# Patient Record
Sex: Male | Born: 1955 | Race: White | Hispanic: No | Marital: Married | Smoking: Former smoker
Health system: Southern US, Community
[De-identification: ages and names within clinical notes are randomized; demographics above are authoritative.]

## PROBLEM LIST (undated history)

## (undated) DIAGNOSIS — N179 Acute kidney failure, unspecified: Secondary | ICD-10-CM

## (undated) DIAGNOSIS — I2510 Atherosclerotic heart disease of native coronary artery without angina pectoris: Secondary | ICD-10-CM

## (undated) DIAGNOSIS — E1121 Type 2 diabetes mellitus with diabetic nephropathy: Secondary | ICD-10-CM

## (undated) DIAGNOSIS — I10 Essential (primary) hypertension: Secondary | ICD-10-CM

## (undated) DIAGNOSIS — I639 Cerebral infarction, unspecified: Secondary | ICD-10-CM

## (undated) DIAGNOSIS — Z7902 Long term (current) use of antithrombotics/antiplatelets: Secondary | ICD-10-CM

## (undated) DIAGNOSIS — R809 Proteinuria, unspecified: Secondary | ICD-10-CM

## (undated) DIAGNOSIS — E782 Mixed hyperlipidemia: Secondary | ICD-10-CM

## (undated) DIAGNOSIS — K5010 Crohn's disease of large intestine without complications: Secondary | ICD-10-CM

## (undated) DIAGNOSIS — Z79899 Other long term (current) drug therapy: Secondary | ICD-10-CM

## (undated) DIAGNOSIS — K219 Gastro-esophageal reflux disease without esophagitis: Secondary | ICD-10-CM

## (undated) DIAGNOSIS — E538 Deficiency of other specified B group vitamins: Secondary | ICD-10-CM

## (undated) DIAGNOSIS — Z794 Long term (current) use of insulin: Secondary | ICD-10-CM

## (undated) DIAGNOSIS — I2584 Coronary atherosclerosis due to calcified coronary lesion: Secondary | ICD-10-CM

## (undated) HISTORY — DX: Atherosclerotic heart disease of native coronary artery without angina pectoris: I25.10

## (undated) HISTORY — DX: Long term (current) use of antithrombotics/antiplatelets: Z79.02

## (undated) HISTORY — DX: Essential (primary) hypertension: I10

## (undated) HISTORY — DX: Long term (current) use of insulin: E11.21

## (undated) HISTORY — DX: Cerebral infarction, unspecified: I63.9

## (undated) HISTORY — DX: Hypomagnesemia: E83.42

## (undated) HISTORY — DX: Gastro-esophageal reflux disease without esophagitis: K21.9

## (undated) HISTORY — PX: COLONOSCOPY: SHX174

## (undated) HISTORY — DX: Coronary atherosclerosis due to calcified coronary lesion: I25.84

## (undated) HISTORY — DX: Other long term (current) drug therapy: Z79.899

## (undated) HISTORY — DX: Proteinuria, unspecified: R80.9

## (undated) HISTORY — PX: NECK MASS EXCISION: SHX2079

## (undated) HISTORY — DX: Deficiency of other specified B group vitamins: E53.8

## (undated) HISTORY — PX: DG GREAT TOE LEFT FOOT: HXRAD1656

## (undated) HISTORY — PX: CHOLECYSTECTOMY: SHX55

## (undated) HISTORY — DX: Long term (current) use of insulin: Z79.4

## (undated) HISTORY — DX: Mixed hyperlipidemia: E78.2

---

## 2010-11-30 DEATH — deceased

## 2015-04-02 DIAGNOSIS — N179 Acute kidney failure, unspecified: Secondary | ICD-10-CM

## 2015-04-02 HISTORY — DX: Acute kidney failure, unspecified: N17.9

## 2015-08-14 ENCOUNTER — Encounter (INDEPENDENT_AMBULATORY_CARE_PROVIDER_SITE_OTHER): Payer: Self-pay | Admitting: Internal Medicine

## 2015-08-14 ENCOUNTER — Encounter (HOSPITAL_COMMUNITY): Payer: Self-pay

## 2015-08-14 ENCOUNTER — Emergency Department (HOSPITAL_COMMUNITY): Payer: Medicare Other

## 2015-08-14 ENCOUNTER — Ambulatory Visit (INDEPENDENT_AMBULATORY_CARE_PROVIDER_SITE_OTHER): Payer: BLUE CROSS/BLUE SHIELD | Admitting: Internal Medicine

## 2015-08-14 ENCOUNTER — Inpatient Hospital Stay (HOSPITAL_COMMUNITY)
Admission: EM | Admit: 2015-08-14 | Discharge: 2015-08-16 | DRG: 315 | Disposition: A | Discharge status: Home / Self Care | Payer: Medicare Other | Attending: Internal Medicine | Admitting: Internal Medicine

## 2015-08-14 ENCOUNTER — Other Ambulatory Visit (INDEPENDENT_AMBULATORY_CARE_PROVIDER_SITE_OTHER): Payer: Self-pay | Admitting: *Deleted

## 2015-08-14 VITALS — BP 82/58 | HR 64 | Temp 96.9°F | Resp 18 | Ht 72.0 in | Wt 249.9 lb

## 2015-08-14 DIAGNOSIS — N183 Chronic kidney disease, stage 3 unspecified: Secondary | ICD-10-CM | POA: Diagnosis present

## 2015-08-14 DIAGNOSIS — R630 Anorexia: Secondary | ICD-10-CM | POA: Diagnosis present

## 2015-08-14 DIAGNOSIS — E782 Mixed hyperlipidemia: Secondary | ICD-10-CM | POA: Diagnosis present

## 2015-08-14 DIAGNOSIS — R1319 Other dysphagia: Secondary | ICD-10-CM

## 2015-08-14 DIAGNOSIS — K219 Gastro-esophageal reflux disease without esophagitis: Secondary | ICD-10-CM | POA: Diagnosis not present

## 2015-08-14 DIAGNOSIS — Z87891 Personal history of nicotine dependence: Secondary | ICD-10-CM

## 2015-08-14 DIAGNOSIS — I129 Hypertensive chronic kidney disease with stage 1 through stage 4 chronic kidney disease, or unspecified chronic kidney disease: Secondary | ICD-10-CM | POA: Diagnosis present

## 2015-08-14 DIAGNOSIS — I10 Essential (primary) hypertension: Secondary | ICD-10-CM | POA: Diagnosis present

## 2015-08-14 DIAGNOSIS — I959 Hypotension, unspecified: Principal | ICD-10-CM | POA: Diagnosis present

## 2015-08-14 DIAGNOSIS — R1310 Dysphagia, unspecified: Secondary | ICD-10-CM

## 2015-08-14 DIAGNOSIS — Z8673 Personal history of transient ischemic attack (TIA), and cerebral infarction without residual deficits: Secondary | ICD-10-CM | POA: Diagnosis not present

## 2015-08-14 DIAGNOSIS — E119 Type 2 diabetes mellitus without complications: Secondary | ICD-10-CM

## 2015-08-14 DIAGNOSIS — D72829 Elevated white blood cell count, unspecified: Secondary | ICD-10-CM | POA: Diagnosis present

## 2015-08-14 DIAGNOSIS — E871 Hypo-osmolality and hyponatremia: Secondary | ICD-10-CM | POA: Diagnosis present

## 2015-08-14 DIAGNOSIS — Z833 Family history of diabetes mellitus: Secondary | ICD-10-CM | POA: Diagnosis not present

## 2015-08-14 DIAGNOSIS — E1122 Type 2 diabetes mellitus with diabetic chronic kidney disease: Secondary | ICD-10-CM | POA: Diagnosis present

## 2015-08-14 DIAGNOSIS — R197 Diarrhea, unspecified: Secondary | ICD-10-CM | POA: Diagnosis not present

## 2015-08-14 DIAGNOSIS — E1121 Type 2 diabetes mellitus with diabetic nephropathy: Secondary | ICD-10-CM | POA: Diagnosis present

## 2015-08-14 DIAGNOSIS — K5010 Crohn's disease of large intestine without complications: Secondary | ICD-10-CM | POA: Diagnosis present

## 2015-08-14 DIAGNOSIS — Z823 Family history of stroke: Secondary | ICD-10-CM | POA: Diagnosis not present

## 2015-08-14 DIAGNOSIS — Z8261 Family history of arthritis: Secondary | ICD-10-CM | POA: Diagnosis not present

## 2015-08-14 DIAGNOSIS — E860 Dehydration: Secondary | ICD-10-CM | POA: Diagnosis present

## 2015-08-14 DIAGNOSIS — Z8249 Family history of ischemic heart disease and other diseases of the circulatory system: Secondary | ICD-10-CM | POA: Diagnosis not present

## 2015-08-14 DIAGNOSIS — R1314 Dysphagia, pharyngoesophageal phase: Secondary | ICD-10-CM

## 2015-08-14 DIAGNOSIS — Z794 Long term (current) use of insulin: Secondary | ICD-10-CM

## 2015-08-14 DIAGNOSIS — K210 Gastro-esophageal reflux disease with esophagitis: Secondary | ICD-10-CM | POA: Diagnosis present

## 2015-08-14 DIAGNOSIS — I9589 Other hypotension: Secondary | ICD-10-CM | POA: Diagnosis not present

## 2015-08-14 DIAGNOSIS — IMO0001 Reserved for inherently not codable concepts without codable children: Secondary | ICD-10-CM

## 2015-08-14 DIAGNOSIS — N179 Acute kidney failure, unspecified: Secondary | ICD-10-CM | POA: Diagnosis present

## 2015-08-14 DIAGNOSIS — E872 Acidosis: Secondary | ICD-10-CM | POA: Diagnosis present

## 2015-08-14 DIAGNOSIS — Z8719 Personal history of other diseases of the digestive system: Secondary | ICD-10-CM

## 2015-08-14 HISTORY — DX: Crohn's disease of large intestine without complications: K50.10

## 2015-08-14 HISTORY — DX: Acute kidney failure, unspecified: N17.9

## 2015-08-14 LAB — CBC WITH DIFFERENTIAL/PLATELET
BASOS ABS: 0 10*3/uL (ref 0.0–0.1)
BASOS PCT: 0 %
Eosinophils Absolute: 0.1 10*3/uL (ref 0.0–0.7)
Eosinophils Relative: 0 %
HEMATOCRIT: 46.1 % (ref 39.0–52.0)
HEMOGLOBIN: 15.3 g/dL (ref 13.0–17.0)
LYMPHS PCT: 25 %
Lymphs Abs: 3.6 10*3/uL (ref 0.7–4.0)
MCH: 28.9 pg (ref 26.0–34.0)
MCHC: 33.2 g/dL (ref 30.0–36.0)
MCV: 87.1 fL (ref 78.0–100.0)
Monocytes Absolute: 1.1 10*3/uL — ABNORMAL HIGH (ref 0.1–1.0)
Monocytes Relative: 8 %
NEUTROS ABS: 9.7 10*3/uL — AB (ref 1.7–7.7)
NEUTROS PCT: 67 %
Platelets: 376 10*3/uL (ref 150–400)
RBC: 5.29 MIL/uL (ref 4.22–5.81)
RDW: 14.9 % (ref 11.5–15.5)
WBC: 14.4 10*3/uL — ABNORMAL HIGH (ref 4.0–10.5)

## 2015-08-14 LAB — COMPREHENSIVE METABOLIC PANEL
ALBUMIN: 4.4 g/dL (ref 3.5–5.0)
ALT: 20 U/L (ref 17–63)
ANION GAP: 9 (ref 5–15)
AST: 17 U/L (ref 15–41)
Alkaline Phosphatase: 89 U/L (ref 38–126)
BUN: 33 mg/dL — ABNORMAL HIGH (ref 6–20)
CHLORIDE: 99 mmol/L — AB (ref 101–111)
CO2: 21 mmol/L — AB (ref 22–32)
CREATININE: 3.15 mg/dL — AB (ref 0.61–1.24)
Calcium: 8.9 mg/dL (ref 8.9–10.3)
GFR calc non Af Amer: 20 mL/min — ABNORMAL LOW (ref >60)
GFR, EST AFRICAN AMERICAN: 23 mL/min — AB (ref >60)
GLUCOSE: 291 mg/dL — AB (ref 65–99)
Potassium: 4.7 mmol/L (ref 3.5–5.1)
SODIUM: 129 mmol/L — AB (ref 135–145)
Total Bilirubin: 0.4 mg/dL (ref 0.3–1.2)
Total Protein: 8.1 g/dL (ref 6.5–8.1)

## 2015-08-14 LAB — LACTIC ACID, PLASMA
LACTIC ACID, VENOUS: 4.4 mmol/L — AB (ref 0.5–2.0)
LACTIC ACID, VENOUS: 2.9 mmol/L — AB (ref 0.5–2.0)

## 2015-08-14 LAB — PROCALCITONIN

## 2015-08-14 LAB — LIPASE, BLOOD: Lipase: 78 U/L — ABNORMAL HIGH (ref 11–51)

## 2015-08-14 LAB — TROPONIN I: Troponin I: <0.03 ng/mL (ref <0.031)

## 2015-08-14 MED ORDER — HEPARIN SODIUM (PORCINE) 5000 UNIT/ML IJ SOLN
5000.0000 [IU] | Freq: Three times a day (TID) | INTRAMUSCULAR | Status: DC
  Administered 2015-08-15 (×4): 5000 [IU] via SUBCUTANEOUS
  Filled 2015-08-14 (×4): qty 1

## 2015-08-14 MED ORDER — PRAVASTATIN SODIUM 40 MG PO TABS
40.0000 mg | ORAL_TABLET | Freq: Every day | ORAL | Status: DC
  Administered 2015-08-15 – 2015-08-16 (×2): 40 mg via ORAL
  Filled 2015-08-14 (×2): qty 1

## 2015-08-14 MED ORDER — INSULIN ASPART 100 UNIT/ML ~~LOC~~ SOLN
6.0000 [IU] | Freq: Three times a day (TID) | SUBCUTANEOUS | Status: DC
  Administered 2015-08-15 – 2015-08-16 (×4): 6 [IU] via SUBCUTANEOUS

## 2015-08-14 MED ORDER — TAMSULOSIN HCL 0.4 MG PO CAPS
0.4000 mg | ORAL_CAPSULE | Freq: Every day | ORAL | Status: DC
  Administered 2015-08-15 – 2015-08-16 (×2): 0.4 mg via ORAL
  Filled 2015-08-14 (×2): qty 1

## 2015-08-14 MED ORDER — SODIUM CHLORIDE 0.9 % IV BOLUS (SEPSIS)
500.0000 mL | Freq: Once | INTRAVENOUS | Status: AC
  Administered 2015-08-14: 500 mL via INTRAVENOUS

## 2015-08-14 MED ORDER — SODIUM CHLORIDE 0.9 % IV BOLUS (SEPSIS)
1000.0000 mL | Freq: Once | INTRAVENOUS | Status: AC
  Administered 2015-08-14: 1000 mL via INTRAVENOUS

## 2015-08-14 MED ORDER — PENTOXIFYLLINE ER 400 MG PO TBCR
400.0000 mg | EXTENDED_RELEASE_TABLET | Freq: Three times a day (TID) | ORAL | Status: DC
  Administered 2015-08-15 – 2015-08-16 (×5): 400 mg via ORAL
  Filled 2015-08-14 (×5): qty 1

## 2015-08-14 MED ORDER — ONDANSETRON HCL 4 MG/2ML IJ SOLN
4.0000 mg | INTRAMUSCULAR | Status: AC | PRN
  Administered 2015-08-14 (×2): 4 mg via INTRAVENOUS
  Filled 2015-08-14 (×2): qty 2

## 2015-08-14 MED ORDER — ACETAMINOPHEN 650 MG RE SUPP: 650.0000 mg | Freq: Four times a day (QID) | RECTAL | Status: DC | PRN

## 2015-08-14 MED ORDER — TRAMADOL HCL 50 MG PO TABS: 50.0000 mg | ORAL_TABLET | Freq: Four times a day (QID) | ORAL | Status: DC | PRN

## 2015-08-14 MED ORDER — ACETAMINOPHEN 325 MG PO TABS
650.0000 mg | ORAL_TABLET | Freq: Four times a day (QID) | ORAL | Status: DC | PRN
  Administered 2015-08-15: 650 mg via ORAL
  Filled 2015-08-14: qty 2

## 2015-08-14 MED ORDER — INSULIN GLARGINE 100 UNIT/ML ~~LOC~~ SOLN
50.0000 [IU] | Freq: Two times a day (BID) | SUBCUTANEOUS | Status: DC
  Administered 2015-08-15 – 2015-08-16 (×4): 50 [IU] via SUBCUTANEOUS
  Filled 2015-08-14 (×8): qty 0.5

## 2015-08-14 MED ORDER — DEXLANSOPRAZOLE 60 MG PO CPDR: 60.0000 mg | DELAYED_RELEASE_CAPSULE | Freq: Every day | ORAL | Status: AC

## 2015-08-14 MED ORDER — INSULIN ASPART 100 UNIT/ML ~~LOC~~ SOLN
0.0000 [IU] | Freq: Three times a day (TID) | SUBCUTANEOUS | Status: DC
  Administered 2015-08-15 (×2): 3 [IU] via SUBCUTANEOUS
  Administered 2015-08-15 – 2015-08-16 (×2): 2 [IU] via SUBCUTANEOUS

## 2015-08-14 MED ORDER — LOPERAMIDE HCL 2 MG PO CAPS: 2.0000 mg | ORAL_CAPSULE | Freq: Four times a day (QID) | ORAL | Status: AC | PRN

## 2015-08-14 MED ORDER — SODIUM CHLORIDE 0.9 % IV SOLN
INTRAVENOUS | Status: DC
  Administered 2015-08-14: 100 mL/h via INTRAVENOUS

## 2015-08-14 MED ORDER — ONDANSETRON HCL 4 MG PO TABS: 4.0000 mg | ORAL_TABLET | Freq: Four times a day (QID) | ORAL | Status: DC | PRN

## 2015-08-14 MED ORDER — OXCARBAZEPINE 300 MG PO TABS
300.0000 mg | ORAL_TABLET | Freq: Three times a day (TID) | ORAL | Status: DC
  Administered 2015-08-15 – 2015-08-16 (×5): 300 mg via ORAL
  Filled 2015-08-14 (×11): qty 1

## 2015-08-14 MED ORDER — SODIUM CHLORIDE 0.9 % IV SOLN
INTRAVENOUS | Status: AC
  Administered 2015-08-15 (×2): via INTRAVENOUS

## 2015-08-14 MED ORDER — SODIUM CHLORIDE 0.9% FLUSH
3.0000 mL | Freq: Two times a day (BID) | INTRAVENOUS | Status: DC
  Administered 2015-08-15 – 2015-08-16 (×3): 3 mL via INTRAVENOUS

## 2015-08-14 MED ORDER — HYDROMORPHONE HCL 1 MG/ML IJ SOLN: 1.0000 mg | INTRAMUSCULAR | Status: DC | PRN

## 2015-08-14 MED ORDER — FAMOTIDINE IN NACL 20-0.9 MG/50ML-% IV SOLN
20.0000 mg | Freq: Once | INTRAVENOUS | Status: AC
  Administered 2015-08-14: 20 mg via INTRAVENOUS
  Filled 2015-08-14: qty 50

## 2015-08-14 MED ORDER — METOCLOPRAMIDE HCL 10 MG PO TABS
10.0000 mg | ORAL_TABLET | Freq: Two times a day (BID) | ORAL | Status: DC
  Administered 2015-08-15 – 2015-08-16 (×4): 10 mg via ORAL
  Filled 2015-08-14 (×4): qty 1

## 2015-08-14 MED ORDER — SODIUM CHLORIDE 0.9 % IV BOLUS (SEPSIS): 500.0000 mL | Freq: Once | INTRAVENOUS | Status: DC

## 2015-08-14 MED ORDER — PANTOPRAZOLE SODIUM 40 MG PO TBEC
40.0000 mg | DELAYED_RELEASE_TABLET | Freq: Every day | ORAL | Status: DC
  Administered 2015-08-15 (×2): 40 mg via ORAL
  Filled 2015-08-14 (×2): qty 1

## 2015-08-14 MED ORDER — SULFASALAZINE 500 MG PO TABS
500.0000 mg | ORAL_TABLET | Freq: Two times a day (BID) | ORAL | Status: DC
  Administered 2015-08-15 – 2015-08-16 (×4): 500 mg via ORAL
  Filled 2015-08-14 (×8): qty 1

## 2015-08-14 MED ORDER — DIATRIZOATE MEGLUMINE & SODIUM 66-10 % PO SOLN
ORAL | Status: AC
  Filled 2015-08-14: qty 30

## 2015-08-14 MED ORDER — PREGABALIN 75 MG PO CAPS
300.0000 mg | ORAL_CAPSULE | Freq: Two times a day (BID) | ORAL | Status: DC
  Administered 2015-08-15 – 2015-08-16 (×4): 300 mg via ORAL
  Filled 2015-08-14 (×4): qty 4

## 2015-08-14 MED ORDER — ONDANSETRON HCL 4 MG/2ML IJ SOLN: 4.0000 mg | Freq: Four times a day (QID) | INTRAMUSCULAR | Status: DC | PRN

## 2015-08-14 NOTE — ED Notes (Signed)
Lab at bedside

## 2015-08-14 NOTE — ED Notes (Signed)
Pt sent by Dr. Karilyn Cotaehman.  Sister says pt was sent here with orders for IVF and stool sample.  Pt has had diarrhea for 2 weeks, dizziness, nausea,  and decreased appetite.

## 2015-08-14 NOTE — ED Provider Notes (Signed)
CSN: [id number]     Arrival date & time 08/14/15  1637 History   First MD Initiated Contact with Patient 08/14/15 1643     Chief Complaint  Patient presents with  . Diarrhea      HPI Pt was seen at 1655.  Per pt, c/o gradual onset and persistence of multiple intermittent episodes of diarrhea for the past 2 weeks. Describes the stools as "yellow" and "watery." Has been associated with nausea and poor PO intake. Pt states he has "passed out a bunch of times," last time this morning, due to lightheadedness and generalized weakness. Endorses hx of same symptoms several months ago, stating he "had to get admitted for it because I was dehydrated." Pt was evaluated by his GI Dr. Karilyn Cota PTA, noted to be hypotensive, and then was sent to the ED for further evaluation. Denies abd pain, no CP/SOB, no back pain, no fevers, no black or blood in stools.     Past Medical History  Diagnosis Date  . Mixed hyperlipidemia   . Hypertension     Beign essential  . Microalbuminuria   . Ischemic stroke (HCC)   . Coronary artery disease due to calcified coronary lesion   . B12 deficiency   . GERD (gastroesophageal reflux disease)   . Hypomagnesemia   . Long term current use of antithrombotics/antiplatelets   . Hypomagnesemia   . Encounter for long-term current use of medication   . Crohn's colitis (HCC)   . Type 2 diabetes mellitus with diabetic nephropathy, with long-term current use of insulin (HCC)   . ARF (acute renal failure) (HCC) 04/2015    [facility]) due to dehydration from diarrhea   Past Surgical History  Procedure Laterality Date  . Dg great toe left foot      Removal  . Cholecystectomy    . Neck mass excision      Tumor - non- Malignant  . Colonoscopy      [facility]. Three years ago   Family History  Problem Relation Age of Onset  . Diabetes Sister   . Bipolar disorder Sister   . Hypertension Sister   . Stroke Sister   . Arthritis Brother   . Stroke Sister   . Healthy  Daughter    Social History  Substance Use Topics  . Smoking status: Former Smoker    Start date: 08/14/2010  . Smokeless tobacco: Current User    Types: Snuff  . Alcohol Use: No    Review of Systems ROS: Statement: All systems negative except as marked or noted in the HPI; Constitutional: Negative for fever and chills. ; ; Eyes: Negative for eye pain, redness and discharge. ; ; ENMT: Negative for ear pain, hoarseness, nasal congestion, sinus pressure and sore throat. ; ; Cardiovascular: Negative for chest pain, palpitations, diaphoresis, dyspnea and peripheral edema. ; ; Respiratory: Negative for cough, wheezing and stridor. ; ; Gastrointestinal: +decreased PO intake, nausea, diarrhea. Negative for vomiting, abdominal pain, blood in stool, hematemesis, jaundice and rectal bleeding. . ; ; Genitourinary: Negative for dysuria, flank pain and hematuria. ; ; Musculoskeletal: Negative for back pain and neck pain. Negative for swelling and trauma.; ; Skin: Negative for pruritus, rash, abrasions, blisters, bruising and skin lesion.; ; Neuro: +lightheadedness, syncope, generalized weakness. Negative for headache and neck stiffness. Negative for extremity weakness, paresthesias, involuntary movement, seizure.      Allergies  Review of patient's allergies indicates no known allergies.  Home Medications   Prior to Admission medications  Medication Sig Start Date End Date Taking? Authorizing Provider  Ca Carbonate-Mag Hydroxide (ROLAIDS PO) Take by mouth as needed.    Historical Provider, MD  clopidogrel (PLAVIX) 75 MG tablet Take 75 mg by mouth daily.    Historical Provider, MD  cyanocobalamin (,VITAMIN B-12,) 1000 MCG/ML injection Inject 1,000 mcg into the muscle every 30 (thirty) days.    Historical Provider, MD  dexlansoprazole (DEXILANT) 60 MG capsule Take 1 capsule (60 mg total) by mouth daily before breakfast. 08/14/15   Malissa HippoNajeeb U Rehman, MD  Insulin Glulisine (APIDRA SOLOSTAR) 100 UNIT/ML  Solostar Pen Inject 18 Units into the skin 3 (three) times daily.    Historical Provider, MD  Liraglutide (VICTOZA) 18 MG/3ML SOPN Inject 0.6 mg into the skin. 1.2 mg into skin daily.    Historical Provider, MD  loperamide (IMODIUM) 2 MG capsule Take 1 capsule (2 mg total) by mouth 4 (four) times daily as needed for diarrhea or loose stools. 08/14/15   Malissa HippoNajeeb U Rehman, MD  Magnesium 250 MG TABS Take 250 mg by mouth 2 (two) times daily.    Historical Provider, MD  metoCLOPramide (REGLAN) 10 MG tablet Take 10 mg by mouth 2 (two) times daily.    Historical Provider, MD  metoprolol tartrate (LOPRESSOR) 25 MG tablet Take 25 mg by mouth 2 (two) times daily.    Historical Provider, MD  Oxcarbazepine (TRILEPTAL) 300 MG tablet Take 300 mg by mouth 3 (three) times daily.    Historical Provider, MD  pentoxifylline (TRENTAL) 400 MG CR tablet Take 400 mg by mouth 3 (three) times daily with meals.    Historical Provider, MD  pravastatin (PRAVACHOL) 40 MG tablet Take 40 mg by mouth daily.    Historical Provider, MD  pregabalin (LYRICA) 300 MG capsule Take 300 mg by mouth 2 (two) times daily.    Historical Provider, MD  sulfaSALAzine (AZULFIDINE) 500 MG tablet Take 500 mg by mouth 2 (two) times daily.    Historical Provider, MD  tamsulosin (FLOMAX) 0.4 MG CAPS capsule Take 0.4 mg by mouth daily.    Historical Provider, MD  traMADol (ULTRAM) 50 MG tablet Take by mouth every 6 (six) hours as needed.    Historical Provider, MD   BP 90/58 mmHg  Pulse 86  Temp(Src) 98 F (36.7 C) (Oral)  Resp 16  Ht 6' (1.829 m)  Wt 247 lb (112.038 kg)  BMI 33.49 kg/m2  SpO2 99%   17:18:20 Orthostatic Vital Signs LR  Orthostatic Lying  - BP- Lying: 70/56 mmHg (Pt felt dizzy and was unable to take Bp sitting and standing up. ) ; Pulse- Lying: 81     Patient Vitals for the past 24 hrs:  BP Temp Temp src Pulse Resp SpO2 Height Weight  08/14/15 1730 (!) 88/62 mmHg - - 80 16 97 % - -  08/14/15 1641 90/58 mmHg 98 F (36.7 C)  Oral 86 16 99 % 6' (1.829 m) 247 lb (112.038 kg)     Physical Exam  1700: Physical examination:  Nursing notes reviewed; Vital signs and O2 SAT reviewed;  Constitutional: Well developed, Well nourished, In no acute distress; Head:  Normocephalic, atraumatic; Eyes: EOMI, PERRL, No scleral icterus; ENMT: Mouth and pharynx normal, Mucous membranes dry; Neck: Supple, Full range of motion, No lymphadenopathy; Cardiovascular: Regular rate and rhythm, No gallop; Respiratory: Breath sounds clear & equal bilaterally, No wheezes.  Speaking full sentences with ease, Normal respiratory effort/excursion; Chest: Nontender, Movement normal; Abdomen: Soft, Nontender, Nondistended, Normal bowel sounds; Genitourinary:  No CVA tenderness; Extremities: Pulses normal, No tenderness, No edema, No calf edema or asymmetry.; Neuro: AA&Ox3, Major CN grossly intact.  Speech clear. No gross focal motor or sensory deficits in extremities.; Skin: Color normal, Warm, Dry.   ED Course  Procedures (including critical care time) Labs Review  Imaging Review  I have personally reviewed and evaluated these images and lab results as part of my medical decision-making.   EKG Interpretation None      MDM  MDM Reviewed: vitals, nursing note and previous chart Reviewed previous: labs and ECG Interpretation: labs, ECG, x-ray and CT scan Total time providing critical care: 30-74 minutes. This excludes time spent performing separately reportable procedures and services. Consults: admitting MD    CRITICAL CARE Performed by: Laray Anger Total critical care time: 35 minutes Critical care time was exclusive of separately billable procedures and treating other patients. Critical care was necessary to treat or prevent imminent or life-threatening deterioration. Critical care was time spent personally by me on the following activities: development of treatment plan with patient and/or surrogate as well as nursing,  discussions with consultants, evaluation of patient's response to treatment, examination of patient, obtaining history from patient or surrogate, ordering and performing treatments and interventions, ordering and review of laboratory studies, ordering and review of radiographic studies, pulse oximetry and re-evaluation of patient's condition.    ED ECG REPORT   Date: 08/14/2015  Rate: 85  Rhythm: normal sinus rhythm  QRS Axis: normal  Intervals: normal  ST/T Wave abnormalities: normal  Conduction Disutrbances:none  Narrative Interpretation:   Old EKG Reviewed: none available.   Results for orders placed or performed during the hospital encounter of 08/14/15  Comprehensive metabolic panel  Result Value Ref Range   Sodium 129 (L) 135 - 145 mmol/L   Potassium 4.7 3.5 - 5.1 mmol/L   Chloride 99 (L) 101 - 111 mmol/L   CO2 21 (L) 22 - 32 mmol/L   Glucose, Bld 291 (H) 65 - 99 mg/dL   BUN 33 (H) 6 - 20 mg/dL   Creatinine, Ser 4.09 (H) 0.61 - 1.24 mg/dL   Calcium 8.9 8.9 - 81.1 mg/dL   Total Protein 8.1 6.5 - 8.1 g/dL   Albumin 4.4 3.5 - 5.0 g/dL   AST 17 15 - 41 U/L   ALT 20 17 - 63 U/L   Alkaline Phosphatase 89 38 - 126 U/L   Total Bilirubin 0.4 0.3 - 1.2 mg/dL   GFR calc non Af Amer 20 (L) >60 mL/min   GFR calc Af Amer 23 (L) >60 mL/min   Anion gap 9 5 - 15  Lipase, blood  Result Value Ref Range   Lipase 78 (H) 11 - 51 U/L  Lactic acid, plasma  Result Value Ref Range   Lactic Acid, Venous 4.4 (HH) 0.5 - 2.0 mmol/L  Lactic acid, plasma  Result Value Ref Range   Lactic Acid, Venous 2.9 (HH) 0.5 - 2.0 mmol/L  CBC with Differential  Result Value Ref Range   WBC 14.4 (H) 4.0 - 10.5 K/uL   RBC 5.29 4.22 - 5.81 MIL/uL   Hemoglobin 15.3 13.0 - 17.0 g/dL   HCT 91.4 78.2 - 95.6 %   MCV 87.1 78.0 - 100.0 fL   MCH 28.9 26.0 - 34.0 pg   MCHC 33.2 30.0 - 36.0 g/dL   RDW 21.3 08.6 - 57.8 %   Platelets 376 150 - 400 K/uL   Neutrophils Relative % 67 %   Neutro Abs 9.7 (  H) 1.7 - 7.7  K/uL   Lymphocytes Relative 25 %   Lymphs Abs 3.6 0.7 - 4.0 K/uL   Monocytes Relative 8 %   Monocytes Absolute 1.1 (H) 0.1 - 1.0 K/uL   Eosinophils Relative 0 %   Eosinophils Absolute 0.1 0.0 - 0.7 K/uL   Basophils Relative 0 %   Basophils Absolute 0.0 0.0 - 0.1 K/uL  Troponin I  Result Value Ref Range   Troponin I <0.03 <0.031 ng/mL   Ct Abdomen Pelvis Wo Contrast 08/14/2015  CLINICAL DATA:  Diarrhea for 2 weeks EXAM: CT ABDOMEN AND PELVIS WITHOUT CONTRAST TECHNIQUE: Multidetector CT imaging of the abdomen and pelvis was performed following the standard protocol without IV contrast. COMPARISON:  None. FINDINGS: Lung bases are free of acute infiltrate or sizable effusion. Calcified lymph nodes are noted in the left hilum consistent with prior granulomatous disease. The gallbladder has been surgically removed. The liver, adrenal glands and pancreas are within normal limits. The spleen demonstrates multiple calcified granulomas. The kidneys are well visualized without renal calculi or obstructive changes. The bladder is decompressed. No pelvic mass lesion is seen. The appendix is within normal limits. Large and small bowel are within normal limits. No acute bony abnormality is seen. IMPRESSION: Changes of prior granulomatous disease. No other focal abnormality is noted. Electronically Signed   By: Alcide Clever M.D.   On: 08/14/2015 20:19   Dg Chest 2 View 08/14/2015  CLINICAL DATA:  Syncopal episode today. EXAM: CHEST  2 VIEW COMPARISON:  None. FINDINGS: Well-defined nodular density in the left upper lung is suggestive for a calcified granuloma. Otherwise, the lungs are clear. Heart and mediastinum are within normal limits. The trachea is midline. Surgical clips in the right lower neck. No large pleural effusions. IMPRESSION: No active cardiopulmonary disease. Electronically Signed   By: Richarda Overlie M.D.   On: 08/14/2015 18:48    2045:  Stool GI pathogen panel pending.  IVF given for  orthostasis/hypotension with slow improvement. Lactic acid trending downward with IVF.  EPIC chart reviewed: BUN/Cr elevated from baseline. CT scan reassuring. Dx and testing d/w pt and family.  Questions answered.  Verb understanding, agreeable to admit. T/C to [facility] Dr. Antionette Char, case discussed, including:  HPI, pertinent PM/SHx, VS/PE, dx testing, ED course and treatment:  Agreeable to admit, requests to write temporary orders, obtain stepdown bed to team APAdmits.     Samuel Jester, DO 08/16/15 0122

## 2015-08-14 NOTE — H&P (Signed)
History and Physical    Paul Wilcox ZOX:[id number] DOB: February 26, 1956 DOA: 08/14/2015  PCP: [name]   Patient coming from: Home   Chief Complaint: Watery diarrhea, lightheadedness   HPI: Paul Wilcox is a 60 y.o. male with medical history significant for Crohn's colitis, hypertension, chronic kidney disease stage III, GERD, and insulin-dependent diabetes mellitus who presents to the ED at the direction of its gastroenterologist for evaluation of 2 weeks of watery diarrhea with subsequent dehydration, lightheadedness, and hypotension. Patient had reportedly been in his usual state until the development of profuse watery diarrhea approximately 2 weeks ago. Patient denies any associated fevers or chills. He denies any associated abdominal pain but does endorse nausea and decreased appetite. There has been no vomiting. He denies any recent long distance travel or sick contacts. Patient was evaluated 10 the gastroenterology clinic earlier in the day and his SBP was 90 despite his history of hypertension. He was advised to present to the emergency department for IV fluids and stool studies.  ED Course: Upon arrival to the ED, patient is found to be afebrile, saturating well on room air, with blood pressure 82/58 and normal heart rate. CT of the abdomen and pelvis is negative for acute pathology and chest x-ray is negative for acute cardiopulmonary disease. CMP is notable for hyponatremia to 129, BUN 33, and serum creatinine 3.15, up from the low 2's. CBC is notable for a leukocytosis to 14,400, lactate is elevated to 4.4, and troponin is undetectable. Patient received 3 L of normal saline as a bolus in the emergency department and was continued on normal saline at a rate of 100 mL per hour. Lactic acid was redrawn and has come down to 2.9. Stool sample was sent for GI pathogen panel and symptomatic care was provided with Zofran. With fluid, blood pressure initially came up to the 110/85 range, but  again felt to 80/50, prompting additional IV fluids to be administered. Patient will be admitted to the stepdown unit for ongoing evaluation and management of profuse watery diarrhea with dehydration, AKI, and hypotension.  Review of Systems:  All other systems reviewed and apart from HPI, are negative.  Past Medical History  Diagnosis Date  . Mixed hyperlipidemia   . Hypertension     Beign essential  . Microalbuminuria   . Ischemic stroke (HCC)   . Coronary artery disease due to calcified coronary lesion   . B12 deficiency   . GERD (gastroesophageal reflux disease)   . Hypomagnesemia   . Long term current use of antithrombotics/antiplatelets   . Hypomagnesemia   . Encounter for long-term current use of medication   . Crohn's colitis (HCC)   . Type 2 diabetes mellitus with diabetic nephropathy, with long-term current use of insulin (HCC)   . ARF (acute renal failure) (HCC) 04/2015    [facility]) due to dehydration from diarrhea    Past Surgical History  Procedure Laterality Date  . Dg great toe left foot      Removal  . Cholecystectomy    . Neck mass excision      Tumor - non- Malignant  . Colonoscopy      [facility]. Three years ago     reports that he has quit smoking. He started smoking about 5 years ago. His smokeless tobacco use includes Snuff. He reports that he does not drink alcohol or use illicit drugs.  No Known Allergies  Family History  Problem Relation Age of Onset  . Diabetes Sister   .  Bipolar disorder Sister   . Hypertension Sister   . Stroke Sister   . Arthritis Brother   . Stroke Sister   . Healthy Daughter      Prior to Admission medications   Medication Sig Start Date End Date Taking? Authorizing Provider  Ca Carbonate-Mag Hydroxide (ROLAIDS PO) Take 1 tablet by mouth daily.    Yes Historical Provider, MD  dexlansoprazole (DEXILANT) 60 MG capsule Take 1 capsule (60 mg total) by mouth daily before breakfast. 08/14/15  Yes Malissa Hippo,  MD  insulin glargine (LANTUS) 100 UNIT/ML injection Inject 60 Units into the skin 2 (two) times daily.   Yes Historical Provider, MD  Insulin Glulisine (APIDRA SOLOSTAR) 100 UNIT/ML Solostar Pen Inject 18 Units into the skin 3 (three) times daily.   Yes Historical Provider, MD  Liraglutide (VICTOZA) 18 MG/3ML SOPN Inject 1.8 mg into the skin daily.    Yes Historical Provider, MD  metoCLOPramide (REGLAN) 10 MG tablet Take 10 mg by mouth 2 (two) times daily.   Yes Historical Provider, MD  metoprolol tartrate (LOPRESSOR) 25 MG tablet Take 25 mg by mouth 2 (two) times daily.   Yes Historical Provider, MD  Oxcarbazepine (TRILEPTAL) 300 MG tablet Take 300 mg by mouth 3 (three) times daily.   Yes Historical Provider, MD  pentoxifylline (TRENTAL) 400 MG CR tablet Take 400 mg by mouth 3 (three) times daily with meals.   Yes Historical Provider, MD  pravastatin (PRAVACHOL) 40 MG tablet Take 40 mg by mouth daily.   Yes Historical Provider, MD  pregabalin (LYRICA) 300 MG capsule Take 300 mg by mouth 2 (two) times daily.   Yes Historical Provider, MD  sulfaSALAzine (AZULFIDINE) 500 MG tablet Take 500 mg by mouth 2 (two) times daily.   Yes Historical Provider, MD  tamsulosin (FLOMAX) 0.4 MG CAPS capsule Take 0.4 mg by mouth daily.   Yes Historical Provider, MD  traMADol (ULTRAM) 50 MG tablet Take by mouth every 6 (six) hours as needed.   Yes Historical Provider, MD  loperamide (IMODIUM) 2 MG capsule Take 1 capsule (2 mg total) by mouth 4 (four) times daily as needed for diarrhea or loose stools. 08/14/15   Malissa Hippo, MD    Physical Exam: Filed Vitals:   08/14/15 1900 08/14/15 1930 08/14/15 2035 08/14/15 2128  BP: 92/61 114/88 94/59 80/53   Pulse:  73 85 93  Temp:    98 F (36.7 C)  TempSrc:    Oral  Resp: 25 23  20   Height:      Weight:      SpO2:  98% 98% 97%      Constitutional: NAD, calm, comfortable Eyes: PERTLA, lids and conjunctivae normal ENMT: Mucous membranes are moist. Posterior  pharynx clear of any exudate or lesions.   Neck: normal, supple, no masses, no thyromegaly Respiratory: clear to auscultation bilaterally, no wheezing, no crackles. Normal respiratory effort.  Cardiovascular: S1 & S2 heard, regular rate and rhythm, no significant murmurs / rubs / gallops. No extremity edema. 2+ pedal pulses.   Abdomen: No distension, no tenderness, no masses palpated. Bowel sounds active.  Musculoskeletal: no clubbing / cyanosis. No joint deformity upper and lower extremities. Normal muscle tone.  Skin: no significant rashes, lesions, ulcers. Warm, dry, well-perfused. Neurologic: CN 2-12 grossly intact. Sensation intact, DTR normal. Strength 5/5 in all 4 limbs.  Psychiatric: Normal judgment and insight. Alert and oriented x 3. Normal mood and affect.     Labs on Admission: I have personally  reviewed following labs and imaging studies  CBC:  Recent Labs Lab 08/14/15 1657  WBC 14.4*  NEUTROABS 9.7*  HGB 15.3  HCT 46.1  MCV 87.1  PLT 376   Basic Metabolic Panel:  Recent Labs Lab 08/14/15 1657  NA 129*  K 4.7  CL 99*  CO2 21*  GLUCOSE 291*  BUN 33*  CREATININE 3.15*  CALCIUM 8.9   GFR: Estimated Creatinine Clearance: 33 mL/min (by C-G formula based on Cr of 3.15). Liver Function Tests:  Recent Labs Lab 08/14/15 1657  AST 17  ALT 20  ALKPHOS 89  BILITOT 0.4  PROT 8.1  ALBUMIN 4.4    Recent Labs Lab 08/14/15 1657  LIPASE 78*   No results for input(s): AMMONIA in the last 168 hours. Coagulation Profile: No results for input(s): INR, PROTIME in the last 168 hours. Cardiac Enzymes:  Recent Labs Lab 08/14/15 1657  TROPONINI <0.03   BNP (last 3 results) No results for input(s): PROBNP in the last 8760 hours. HbA1C: No results for input(s): HGBA1C in the last 72 hours. CBG: No results for input(s): GLUCAP in the last 168 hours. Lipid Profile: No results for input(s): CHOL, HDL, LDLCALC, TRIG, CHOLHDL, LDLDIRECT in the last 72  hours. Thyroid Function Tests: No results for input(s): TSH, T4TOTAL, FREET4, T3FREE, THYROIDAB in the last 72 hours. Anemia Panel: No results for input(s): VITAMINB12, FOLATE, FERRITIN, TIBC, IRON, RETICCTPCT in the last 72 hours. Urine analysis: No results found for: COLORURINE, APPEARANCEUR, LABSPEC, PHURINE, GLUCOSEU, HGBUR, BILIRUBINUR, KETONESUR, PROTEINUR, UROBILINOGEN, NITRITE, LEUKOCYTESUR Sepsis Labs: @LABRCNTIP (procalcitonin:4,lacticidven:4) )No results found for this or any previous visit (from the past 240 hour(s)).   Radiological Exams on Admission: Ct Abdomen Pelvis Wo Contrast  08/14/2015  CLINICAL DATA:  Diarrhea for 2 weeks EXAM: CT ABDOMEN AND PELVIS WITHOUT CONTRAST TECHNIQUE: Multidetector CT imaging of the abdomen and pelvis was performed following the standard protocol without IV contrast. COMPARISON:  None. FINDINGS: Lung bases are free of acute infiltrate or sizable effusion. Calcified lymph nodes are noted in the left hilum consistent with prior granulomatous disease. The gallbladder has been surgically removed. The liver, adrenal glands and pancreas are within normal limits. The spleen demonstrates multiple calcified granulomas. The kidneys are well visualized without renal calculi or obstructive changes. The bladder is decompressed. No pelvic mass lesion is seen. The appendix is within normal limits. Large and small bowel are within normal limits. No acute bony abnormality is seen. IMPRESSION: Changes of prior granulomatous disease. No other focal abnormality is noted. Electronically Signed   By: Alcide CleverMark  Lukens M.D.   On: 08/14/2015 20:19   Dg Chest 2 View  08/14/2015  CLINICAL DATA:  Syncopal episode today. EXAM: CHEST  2 VIEW COMPARISON:  None. FINDINGS: Well-defined nodular density in the left upper lung is suggestive for a calcified granuloma. Otherwise, the lungs are clear. Heart and mediastinum are within normal limits. The trachea is midline. Surgical clips in the  right lower neck. No large pleural effusions. IMPRESSION: No active cardiopulmonary disease. Electronically Signed   By: Richarda OverlieAdam  Henn M.D.   On: 08/14/2015 18:48    EKG: Ordered and pending.   Assessment/Plan  1. Hypotension with hx of HTN  - BP 82/58 on presentation, suspected secondary to intravascular volume depletion in setting of poor PO intake and watery diarrhea   - Received 3 L NS in ED with initial improvement; BP returned to 80/50 range once fluids completed  - Continue IVF resuscitation with NS at 125 cc/hr overnight, re-bolus  if needed for BP-support   2. Watery diarrhea  - Has reportedly been ongoing for 2 wks  - No fever, but leukocytosis to 14,400 noted  - GI pathogen panel sent, will follow  - Infection-control with enteric precautions   3. AKI superimposed on CKD stage III  - SCr 3.15 on admission, up from apparent baseline in low 2-range  - Suspect a prerenal azotemia in setting of hypotension and dehydration, ATN possible  - Anticipate improvement with IVF resuscitation  - Avoid nephrotoxins; repeat chem panel in am    4. GERD - Managed with Dexilant at home  - Continue PPI with Protonix while in hospital   5. Crohn's colitis - No convincing evidence of flare  - Managed with sulfasalazine, will continue    6. Hyponatremia  - Serum sodium 129 in setting of dehydration  - Anticipate resolution with IVF resuscitation - Repeat chem panel in am    DVT prophylaxis: sq heparin Code Status: Full   Family Communication: Wife and children updated at bedside  Disposition Plan: Admit to stepdown unit  Consults called: GI consulted by the EDP  Admission status: Inpatient    Briscoe Deutscher, MD [facility] Pager [phone]  If 7PM-7AM, please contact night-coverage [email or web address] Password [facility]  08/14/2015, 10:49 PM

## 2015-08-14 NOTE — Progress Notes (Signed)
Reason for consultation;  Solid food dysphagia and diarrhea. Streaky of Crohn's disease.  History of present illness:  Paul Wilcox is 60 year old Caucasian male who is referred through courtesy of Mr. Paul Wilcox, [place] for GI evaluation. Patient is well known to me from evaluation over 20 years ago when he was diagnosed with Crohn's disease but he has not been seen recently. [place] [facility] [place] he could be closer to his family. He is originally from [place].  He has multiple complaints.  He states he's had swallowing difficulty for 1 month. He has difficulty with solids but not liquids. He points to suprasternal area soft bolus obstruction. He also complains of feeling lump in his throat. He has been hoarse for one year. He was seen by ENT specialist in told he had inflammation of vocal cord secondary to gastroesophageal reflux disease. He was on pantoprazole 40 mg twice a day but was discontinued because of concern for kidney disease. He has daily heartburn he has frequent nocturnal regurgitation.  He also complains of diarrhea which started 2 weeks ago. It states his been nonstop. He is having least 6 stools per day. His stools are watery and yellow. He denies melena or rectal bleeding. He also denies abdominal pain. He has had nausea but no vomiting. He states he's had a poor appetite for the last 4 days and has been taking sips of liquids. He is afraid to eat. He states he has lost 20 pounds in the last 2 months. He states he gets diarrhea every now and then when it would last for no more than 2-3 days. In between these spells he has normal bowel movements. He denies fever or chills. When he has diarrhea he has urgency and has had a few accidents. He always struggles with extra care of clothes in his car. He feels weak and lightheaded. He feels like he may pass out. He states he had colonoscopy 3 years ago in [facility] and  this exam was normal. His physicians decided to leave him on sulfasalazine since he has done so well. However he's only been taking 500 mg twice daily. He also complains of intolerance to milk. He says every time he drinks milk he gets cramping and diarrhea.   Current Medications: Outpatient Encounter Prescriptions as of 08/14/2015  Medication Sig  . Ca Carbonate-Mag Hydroxide (ROLAIDS PO) Take by mouth as needed.  . clopidogrel (PLAVIX) 75 MG tablet Take 75 mg by mouth daily.  . cyanocobalamin (,VITAMIN B-12,) 1000 MCG/ML injection Inject 1,000 mcg into the muscle every 30 (thirty) days.  . Insulin Glulisine (APIDRA SOLOSTAR) 100 UNIT/ML Solostar Pen Inject 18 Units into the skin 3 (three) times daily.  . Liraglutide (VICTOZA) 18 MG/3ML SOPN Inject 0.6 mg into the skin. 1.2 mg into skin daily.  . Magnesium 250 MG TABS Take 250 mg by mouth 2 (two) times daily.  . metoCLOPramide (REGLAN) 10 MG tablet Take 10 mg by mouth 2 (two) times daily.  . metoprolol tartrate (LOPRESSOR) 25 MG tablet Take 25 mg by mouth 2 (two) times daily.  . Oxcarbazepine (TRILEPTAL) 300 MG tablet Take 300 mg by mouth 3 (three) times daily.  . pantoprazole (PROTONIX) 40 MG tablet Take 40 mg by mouth 2 (two) times daily.  . pentoxifylline (TRENTAL) 400 MG CR tablet Take 400 mg by mouth 3 (three) times daily with meals.  . pravastatin (PRAVACHOL) 40 MG tablet Take 40 mg  by mouth daily.  . pregabalin (LYRICA) 300 MG capsule Take 300 mg by mouth 2 (two) times daily.  Marland Kitchen. sulfaSALAzine (AZULFIDINE) 500 MG tablet Take 500 mg by mouth 2 (two) times daily.  . tamsulosin (FLOMAX) 0.4 MG CAPS capsule Take 0.4 mg by mouth daily.  . traMADol (ULTRAM) 50 MG tablet Take by mouth every 6 (six) hours as needed.   No facility-administered encounter medications on file as of 08/14/2015.   Past medical history:  Obesity. He states he has had problems with weight since he was in high school. About 3 years ago he weighed 280 pounds and  managed to lose down to 170 pounds but now is back up to 250 pounds. Hypertension. Hyperlipidemia. Chronic GERD. Crohn's disease diagnosed 23 years ago possibly involving. B12 deficiency. Hypomagnesemia. Coronary artery disease. He suffered mild MI 8 years ago. He's been diabetic for 7 years. Control suboptimal. 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 History of sinus tachycardia. He states his heart rate runs around 110 or higher. Peripheral neuropathy. History of syncopal episodes without explanation. History of depression but he does not agree with this diagnosis. Excision of right neck mass 8 years ago and it turned out to be benign. The cystectomy about 3 years ago. Amputation of left great toe 3 years ago. He was also diagnosed with osteomyelitis wanting surgery and receive IV antibiotics for 3 weeks. Right cataract extraction   Allergies: No Known Allergies  Family history:  Mother was diagnosed with retroperitoneal paraganglioma. She has surgery and is in remission Father died of brain cancer at age 59. He has 1 brother and 2 sisters Brother has arthritis and lyme disease.Marland Kitchen. His sister [name] who is accompanying him today has had CVA.. Other sister who is an Charity fundraiserN also has multiple medical problems including diabetes mellitus.  Social history:  Patient is married(third marriage). He has 2 daughters. 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 He smoked as an pack of cigarette per day for over 30 years and quit 5 years ago. He used to drink alcohol socially but quit 5 years ago.  Physical examination: Blood pressure 82/58, pulse 64, temperature 96.9 F (36.1 C), temperature source Oral, resp. rate 18, height 6' (1.829 m), weight 249 lb 14.4 oz (113.354 kg). Patient is alert and in no acute distress. He appears to be chronically ill. Conjunctiva is pink. Sclera is  nonicteric Oropharyngeal mucosa is normal. No neck masses or thyromegaly noted. Cardiac exam with regular rhythm normal S1 and S2. No murmur or gallop noted. Lungs are clear to auscultation. Abdomen is obese. Bowel sounds are normal. On palpation he has mild tenderness at LLQ. No organomegaly or masses. Rectal examination deferred. No LE edema or clubbing noted.  Labs/studies Results: Lab data from 06/23/2015  WBC 9.11 H&H 12.6 and 38.5 and platelet count 309K  Serum sodium 140, potassium 4.7, chloride 104, CO2 26, BUN 14, creatinine 1.1  Glucose 300.  Bilirubin 0.2, AP 113, AST 21, ALT is 16, total protein 6.9 and albumin 4.1.  Serum calcium  Serum magnesium 1.2.  Hemoglobin A1c 10.4.  Wyman B 12 level 420   Assessment:  #1. Solid food dysphagia. He could have esophageal stricture given poorly controlled gastroesophageal symptoms. He will need an EGD for diagnostic and therapeutic purposes. #2. Acute diarrhea. He has had nonbloody diarrhea for 2 weeks. Oral intake has been poor. He is hypotensive  and has postural symptoms and therefore felt to be dehydrated. #3. Chronic GERD. Patient was on PPI which was discontinued because of concern for kidney damage. However renal function was normal as evidenced by serum creatinine of 1.1 on 06/23/2015. Given patient's symptoms benefit of using PPI far outweighs the risk. Patient is on metoclopramide and I wonder if he also has gastroparesis. #4. Dehydration secondary to copious diarrhea and diminished oral intake. #5. History of Crohn's disease. This was diagnosed 23 years ago when he was living in [facility]. He has been on low-dose sulfasalazine ever since. Colonoscopy in [facility] 3 years ago was normal. He will need to be on folic acid if he is going to continue sulfasalazine.   Recommendations:  Ration advised to go to emergency room for IV fluids and further evaluation. I have contacted Dr. Clarene Duke and  informed her that patient will be arriving shortly. Dexilant 60 mg by mouth every morning. Imodium OTC 2 mg by mouth 4 times a day when necessary. GI pathogen panel. Hopefully this can be done while he is in emergency room. Will try to obtain colonoscopy records from 23 years ago. Esophagogastroduodenoscopy with esophageal dilation to be performed in near future.

## 2015-08-14 NOTE — ED Notes (Signed)
CRITICAL VALUE ALERT  Critical value received:  Lactic Acid 4.4  Date of notification:  08/14/15  Time of notification:  1816  Critical value read back:Yes.    Nurse who received alert:  Viviano SimasLauren Cleon Signorelli, RN  MD notified (1st page):  Clarene DukeMcManus

## 2015-08-14 NOTE — Patient Instructions (Addendum)
Patient advised to go to emergency room for IV fluids and further evaluation. Dr. Clarene DukeMcManus was informed. Can take Imodium OTC 2 mg up to 4 times a day as needed. Esophagogastroduodenoscopy with esophageal dilation to be scheduled in near future

## 2015-08-14 NOTE — ED Notes (Signed)
CRITICAL VALUE ALERT  Critical value received:  LA  Date of notification:  08/14/15  Time of notification:  2018  Critical value read back:Yes.    Nurse who received alert:  Malena Catholicsamantha Ashiya Kinkead  MD notified (1st page):  [email or web address]  Time of first page:  2018   Time MD responded:  2018

## 2015-08-15 ENCOUNTER — Encounter (HOSPITAL_COMMUNITY)
Admission: EM | Disposition: A | Discharge status: Home / Self Care | Payer: Self-pay | Source: Home / Self Care | Attending: Internal Medicine

## 2015-08-15 DIAGNOSIS — N179 Acute kidney failure, unspecified: Secondary | ICD-10-CM

## 2015-08-15 DIAGNOSIS — E871 Hypo-osmolality and hyponatremia: Secondary | ICD-10-CM

## 2015-08-15 DIAGNOSIS — E860 Dehydration: Secondary | ICD-10-CM

## 2015-08-15 DIAGNOSIS — I9589 Other hypotension: Secondary | ICD-10-CM

## 2015-08-15 DIAGNOSIS — R630 Anorexia: Secondary | ICD-10-CM

## 2015-08-15 DIAGNOSIS — K219 Gastro-esophageal reflux disease without esophagitis: Secondary | ICD-10-CM

## 2015-08-15 DIAGNOSIS — K5010 Crohn's disease of large intestine without complications: Secondary | ICD-10-CM

## 2015-08-15 DIAGNOSIS — N183 Chronic kidney disease, stage 3 (moderate): Secondary | ICD-10-CM

## 2015-08-15 DIAGNOSIS — R197 Diarrhea, unspecified: Secondary | ICD-10-CM

## 2015-08-15 LAB — URINE MICROSCOPIC-ADD ON

## 2015-08-15 LAB — APTT: APTT: 31 s (ref 24–37)

## 2015-08-15 LAB — GASTROINTESTINAL PANEL BY PCR, STOOL (REPLACES STOOL CULTURE)
ADENOVIRUS F40/41: NOT DETECTED
ASTROVIRUS: NOT DETECTED
CRYPTOSPORIDIUM: NOT DETECTED
CYCLOSPORA CAYETANENSIS: NOT DETECTED
Campylobacter species: NOT DETECTED
E. coli O157: NOT DETECTED
ENTEROAGGREGATIVE E COLI (EAEC): NOT DETECTED
ENTEROPATHOGENIC E COLI (EPEC): NOT DETECTED
Entamoeba histolytica: NOT DETECTED
Enterotoxigenic E coli (ETEC): NOT DETECTED
GIARDIA LAMBLIA: NOT DETECTED
Norovirus GI/GII: NOT DETECTED
Plesimonas shigelloides: NOT DETECTED
Rotavirus A: NOT DETECTED
Salmonella species: NOT DETECTED
Sapovirus (I, II, IV, and V): NOT DETECTED
Shiga like toxin producing E coli (STEC): NOT DETECTED
Shigella/Enteroinvasive E coli (EIEC): NOT DETECTED
VIBRIO CHOLERAE: NOT DETECTED
VIBRIO SPECIES: NOT DETECTED
YERSINIA ENTEROCOLITICA: NOT DETECTED

## 2015-08-15 LAB — GLUCOSE, CAPILLARY
GLUCOSE-CAPILLARY: 141 mg/dL — AB (ref 65–99)
GLUCOSE-CAPILLARY: 184 mg/dL — AB (ref 65–99)
GLUCOSE-CAPILLARY: 152 mg/dL — AB (ref 65–99)
Glucose-Capillary: 168 mg/dL — ABNORMAL HIGH (ref 65–99)
Glucose-Capillary: 149 mg/dL — ABNORMAL HIGH (ref 65–99)

## 2015-08-15 LAB — BASIC METABOLIC PANEL
Anion gap: 8 (ref 5–15)
BUN: 37 mg/dL — ABNORMAL HIGH (ref 6–20)
CHLORIDE: 108 mmol/L (ref 101–111)
CO2: 16 mmol/L — AB (ref 22–32)
CREATININE: 2.95 mg/dL — AB (ref 0.61–1.24)
Calcium: 7.8 mg/dL — ABNORMAL LOW (ref 8.9–10.3)
GFR calc non Af Amer: 22 mL/min — ABNORMAL LOW (ref >60)
GFR, EST AFRICAN AMERICAN: 25 mL/min — AB (ref >60)
Glucose, Bld: 150 mg/dL — ABNORMAL HIGH (ref 65–99)
Potassium: 4.3 mmol/L (ref 3.5–5.1)
Sodium: 132 mmol/L — ABNORMAL LOW (ref 135–145)

## 2015-08-15 LAB — PROTIME-INR
INR: 1.24 (ref 0.00–1.49)
Prothrombin Time: 15.7 seconds — ABNORMAL HIGH (ref 11.6–15.2)

## 2015-08-15 LAB — LACTIC ACID, PLASMA
LACTIC ACID, VENOUS: 1.5 mmol/L (ref 0.5–2.0)
Lactic Acid, Venous: 0.6 mmol/L (ref 0.5–2.0)

## 2015-08-15 LAB — URINALYSIS, ROUTINE W REFLEX MICROSCOPIC
BILIRUBIN URINE: NEGATIVE
GLUCOSE, UA: NEGATIVE mg/dL
HGB URINE DIPSTICK: NEGATIVE
Leukocytes, UA: NEGATIVE
Nitrite: NEGATIVE
PH: 5 (ref 5.0–8.0)
Protein, ur: 30 mg/dL — AB
Specific Gravity, Urine: 1.015 (ref 1.005–1.030)

## 2015-08-15 LAB — MRSA PCR SCREENING: MRSA by PCR: NEGATIVE

## 2015-08-15 SURGERY — ESOPHAGOGASTRODUODENOSCOPY (EGD) WITH PROPOFOL
Anesthesia: Monitor Anesthesia Care

## 2015-08-15 MED ORDER — PANTOPRAZOLE SODIUM 40 MG PO TBEC
40.0000 mg | DELAYED_RELEASE_TABLET | Freq: Two times a day (BID) | ORAL | Status: DC
  Administered 2015-08-15 – 2015-08-16 (×2): 40 mg via ORAL
  Filled 2015-08-15 (×2): qty 1

## 2015-08-15 MED ORDER — SULFASALAZINE 500 MG PO TBEC
DELAYED_RELEASE_TABLET | ORAL | Status: AC
  Filled 2015-08-15: qty 1

## 2015-08-15 MED ORDER — OXCARBAZEPINE 300 MG PO TABS
ORAL_TABLET | ORAL | Status: AC
  Filled 2015-08-15: qty 1

## 2015-08-15 MED ORDER — LOPERAMIDE HCL 2 MG PO CAPS
2.0000 mg | ORAL_CAPSULE | Freq: Four times a day (QID) | ORAL | Status: DC | PRN
  Administered 2015-08-15: 2 mg via ORAL
  Filled 2015-08-15: qty 1

## 2015-08-15 NOTE — Progress Notes (Signed)
Please see my office note from 08/14/2015 summarizing patient's GI problems. GI pathogen panel is pending. Diarrhea has slowed down. His appetite has improved and he had no difficulty with soft food. EGD/ED as bleeding on outpatient basis unless he needs to be in the hospital beyond weekend. Pantoprazole increased to 40 mg by mouth twice a day. When patient is discharged he will be on Dexilant 60 mg by mouth every morning.  Dr. Jena Gaussourk will see patient over the weekend for GI issues.

## 2015-08-15 NOTE — Progress Notes (Signed)
PROGRESS NOTE    Paul Wilcox  ONG:[id number] DOB: June 03, 1955 DOA: 08/14/2015 PCP: Paul Wilcox  Outpatient Specialists:    Brief Narrative:  [id number] yom presented to the ED for evaluation of 2 weeks of watery diarrhea. While in the ED, patient was found to be hypotensive and was bolused 3L NS. Lactic acid was also noted to be high, but is trending down. Stool sample was collected and sent. Patient was admitted to stepdown for further evaluation.   Assessment & Plan:   Principal Problem:   Hypotension Active Problems:   Diarrhea   Dehydration   Poor appetite   GERD (gastroesophageal reflux disease)   AKI (acute kidney injury) (HCC)   CKD (chronic kidney disease), stage III   Crohn's colitis (HCC)   Hypertension   Hyponatremia   Insulin dependent diabetes mellitus (HCC)  1. Hypotension with a hx of HTN. Suspected secondary to hypovolemia in the setting of poor PO intake and GI losses. CXR negative. Patient received 3L in ED with improvement. Continue IVFs  2. Watery diarrhea. CT abd negative.  Leukocytosis at 14.4. GI pathogen panel in process, will follow up.  3. Acute on chronic CKD stage 3. Likely prerenal azotemia in the setting of hypotension and dehydration. Baseline Cr around 2, currently 2.95. Avoid nephrotoxic drugs. Anticipate continued improvement with IVFs. Continue to monitor. 4. GERD. Continue PPI 5. Crohn's disease. No evidence of flare. Continue sulfasalazine.  6. Hyponatremia. Sodium 132. Improving with IVFs.  7. HTN. Hold antihypertensives in the setting of low BP.     DVT prophylaxis: Heparin Code Status: Full Family Communication: discussed with patient and wife at the bedside Disposition Plan: Discharge home once improved   Consultants:   GI  Procedures:   none  Antimicrobials:   none    Subjective: Feeling better. No episodes of diarrhea since he was in the emergency room yesterday. Has not had any vomiting. Wants to advance  diet  Objective: Filed Vitals:   08/15/15 0200 08/15/15 0300 08/15/15 0400 08/15/15 0500  BP:  124/72 110/67 105/60  Pulse:  85 95 89  Temp: 97.1 F (36.2 C)     TempSrc: Oral     Resp:  20 22 22   Height: 6' (1.829 m)     Weight: 115.3 kg (254 lb 3.1 oz)     SpO2:  100% 99% 96%    Intake/Output Summary (Last 24 hours) at 08/15/15 0615 Last data filed at 08/15/15 0500  Gross per 24 hour  Intake    385 ml  Output      0 ml  Net    385 ml   Filed Weights   08/14/15 1641 08/15/15 0200  Weight: 112.038 kg (247 lb) 115.3 kg (254 lb 3.1 oz)    Examination:  General exam: Appears calm and comfortable  Respiratory system: Clear to auscultation. Respiratory effort normal. Cardiovascular system: S1 & S2 heard, RRR. No JVD, murmurs, rubs, gallops or clicks. No pedal edema. Gastrointestinal system: Abdomen is nondistended, soft and nontender. No organomegaly or masses felt. Normal bowel sounds heard. Central nervous system: Alert and oriented. No focal neurological deficits. Extremities: Symmetric 5 x 5 power. Skin: No rashes, lesions or ulcers Psychiatry: Judgement and insight appear normal. Mood & affect appropriate.     Data Reviewed: I have personally reviewed following labs and imaging studies  CBC:  Recent Labs Lab 08/14/15 1657  WBC 14.4*  NEUTROABS 9.7*  HGB 15.3  HCT 46.1  MCV 87.1  PLT 376  Basic Metabolic Panel:  Recent Labs Lab 08/14/15 1657 08/15/15 0413  NA 129* 132*  K 4.7 4.3  CL 99* 108  CO2 21* 16*  GLUCOSE 291* 150*  BUN 33* 37*  CREATININE 3.15* 2.95*  CALCIUM 8.9 7.8*   GFR: Estimated Creatinine Clearance: 35.8 mL/min (by C-G formula based on Cr of 2.95). Liver Function Tests:  Recent Labs Lab 08/14/15 1657  AST 17  ALT 20  ALKPHOS 89  BILITOT 0.4  PROT 8.1  ALBUMIN 4.4    Recent Labs Lab 08/14/15 1657  LIPASE 78*   Coagulation Profile:  Recent Labs Lab 08/15/15 0413  INR 1.24   Cardiac Enzymes:  Recent  Labs Lab 08/14/15 1657  TROPONINI <0.03   CBG:  Recent Labs Lab 08/15/15 0337  GLUCAP 141*    Radiology Studies: Ct Abdomen Pelvis Wo Contrast  08/14/2015  CLINICAL DATA:  Diarrhea for 2 weeks EXAM: CT ABDOMEN AND PELVIS WITHOUT CONTRAST TECHNIQUE: Multidetector CT imaging of the abdomen and pelvis was performed following the standard protocol without IV contrast. COMPARISON:  None. FINDINGS: Lung bases are free of acute infiltrate or sizable effusion. Calcified lymph nodes are noted in the left hilum consistent with prior granulomatous disease. The gallbladder has been surgically removed. The liver, adrenal glands and pancreas are within normal limits. The spleen demonstrates multiple calcified granulomas. The kidneys are well visualized without renal calculi or obstructive changes. The bladder is decompressed. No pelvic mass lesion is seen. The appendix is within normal limits. Large and small bowel are within normal limits. No acute bony abnormality is seen. IMPRESSION: Changes of prior granulomatous disease. No other focal abnormality is noted. Electronically Signed   By: Alcide Clever M.D.   On: 08/14/2015 20:19   Dg Chest 2 View  08/14/2015  CLINICAL DATA:  Syncopal episode today. EXAM: CHEST  2 VIEW COMPARISON:  None. FINDINGS: Well-defined nodular density in the left upper lung is suggestive for a calcified granuloma. Otherwise, the lungs are clear. Heart and mediastinum are within normal limits. The trachea is midline. Surgical clips in the right lower neck. No large pleural effusions. IMPRESSION: No active cardiopulmonary disease. Electronically Signed   By: Richarda Overlie M.D.   On: 08/14/2015 18:48    Scheduled Meds: . heparin  5,000 Units Subcutaneous Q8H  . insulin aspart  0-15 Units Subcutaneous TID WC  . insulin aspart  6 Units Subcutaneous TID WC  . insulin glargine  50 Units Subcutaneous BID  . metoCLOPramide  10 mg Oral BID  . Oxcarbazepine  300 mg Oral TID  . pantoprazole   40 mg Oral Daily  . pentoxifylline  400 mg Oral TID WC  . pravastatin  40 mg Oral Daily  . pregabalin  300 mg Oral BID  . sodium chloride  500 mL Intravenous Once  . sodium chloride flush  3 mL Intravenous Q12H  . sulfaSALAzine  500 mg Oral BID  . tamsulosin  0.4 mg Oral Daily   Continuous Infusions: . sodium chloride 125 mL/hr at 08/15/15 0500     LOS: 1 day    Time spent: 25 minutes   Erick Blinks, MD [facility] Pager [phone]  If 7PM-7AM, please contact night-coverage [email or web address] Password [facility] 08/15/2015, 6:15 AM

## 2015-08-15 NOTE — Plan of Care (Signed)
Problem: Safety: Goal: Ability to remain free from injury will improve Outcome: Progressing Discussed importance to ring the call bell with all the cords when needing assistance to the bathroom.

## 2015-08-16 DIAGNOSIS — K5010 Crohn's disease of large intestine without complications: Secondary | ICD-10-CM

## 2015-08-16 DIAGNOSIS — R197 Diarrhea, unspecified: Secondary | ICD-10-CM

## 2015-08-16 LAB — CBC
HCT: 35.5 % — ABNORMAL LOW (ref 39.0–52.0)
HEMOGLOBIN: 12 g/dL — AB (ref 13.0–17.0)
MCH: 29.5 pg (ref 26.0–34.0)
MCHC: 33.8 g/dL (ref 30.0–36.0)
MCV: 87.2 fL (ref 78.0–100.0)
PLATELETS: 255 10*3/uL (ref 150–400)
RBC: 4.07 MIL/uL — ABNORMAL LOW (ref 4.22–5.81)
RDW: 14.8 % (ref 11.5–15.5)
WBC: 9.4 10*3/uL (ref 4.0–10.5)

## 2015-08-16 LAB — GLUCOSE, CAPILLARY
GLUCOSE-CAPILLARY: 93 mg/dL (ref 65–99)
Glucose-Capillary: 141 mg/dL — ABNORMAL HIGH (ref 65–99)

## 2015-08-16 LAB — HEMOGLOBIN A1C
HEMOGLOBIN A1C: 9.2 % — AB (ref 4.8–5.6)
MEAN PLASMA GLUCOSE: 217 mg/dL

## 2015-08-16 LAB — BASIC METABOLIC PANEL
Anion gap: 7 (ref 5–15)
BUN: 24 mg/dL — AB (ref 6–20)
CALCIUM: 8.1 mg/dL — AB (ref 8.9–10.3)
CO2: 16 mmol/L — AB (ref 22–32)
Chloride: 110 mmol/L (ref 101–111)
Creatinine, Ser: 1.65 mg/dL — ABNORMAL HIGH (ref 0.61–1.24)
GFR, EST AFRICAN AMERICAN: 51 mL/min — AB (ref >60)
GFR, EST NON AFRICAN AMERICAN: 44 mL/min — AB (ref >60)
GLUCOSE: 217 mg/dL — AB (ref 65–99)
Potassium: 4.4 mmol/L (ref 3.5–5.1)
Sodium: 133 mmol/L — ABNORMAL LOW (ref 135–145)

## 2015-08-16 LAB — PROCALCITONIN: Procalcitonin: <0.1 ng/mL

## 2015-08-16 MED ORDER — FOLIC ACID 1 MG PO TABS: 1.0000 mg | ORAL_TABLET | Freq: Every day | ORAL | Status: AC

## 2015-08-16 NOTE — Progress Notes (Signed)
Patient states understanding of discharge instructions, prescription given. 

## 2015-08-16 NOTE — Discharge Summary (Signed)
Physician Discharge Summary  Paul Wilcox ZOX:[id number] DOB: 08/19/1955 DOA: 08/14/2015  PCP: [name]  Admit date: 08/14/2015 Discharge date: 08/16/2015  Time spent: 35 minutes  Recommendations for Outpatient Follow-up:  1. Follow up with PCP in 1-2 weeks 2. Follow up with Dr. Karilyn Cota next week for scheduled endoscopy.    Discharge Diagnoses:  Principal Problem:   Hypotension Active Problems:   Diarrhea   Dehydration   Poor appetite   GERD (gastroesophageal reflux disease)   AKI (acute kidney injury) (HCC)   CKD (chronic kidney disease), stage III   Crohn's colitis (HCC)   Hypertension   Hyponatremia   Insulin dependent diabetes mellitus (HCC)   Discharge Condition: improved  Diet recommendation: Carb modified  Filed Weights   08/14/15 1641 08/15/15 0200  Weight: 112.038 kg (247 lb) 115.3 kg (254 lb 3.1 oz)    History of present illness:  9 yom with a hx of Crohn's colitis, HTN, CKD stage 3, GERD, and IDDM, presented to the ED, per the direction of Dr. Karilyn Cota, for IVFs and a stool sample for evaluation of 2 weeks of profuse watery diarrhea. Patient reported being in his usual state of health until the onset of his diarrhea. He endorsed associated decreased appetite, dizziness, and lightheadedness. He denied any associated fever or chills. He was not having any abdominal pain either. He had not had any recent long distance travel, or sick contacts.    While in the ED, patient was found to be afebrile with a blood pressure of 82/58. CT abd and pelvis was negative for acute abnormalities. CMP showed hyponatremia with a serum sodium of 129. His creatinine was also notably elevated at 3.15. He had leukocytosis and lactic acidosis. When his blood pressure failed to improve with IVFs, he was admitted for further management.  Hospital Course:  Patient was found to have hypotension, suspected secondary to hypovolemia in the setting of poor PO intake and GI losses after  initially  presenting with complaints of 2 weeks of profuse watery diarrhea. While in the ED, the patient's blood pressure was noted to be 82/58, so he was given IV fluids. However, his blood pressure failed to improve. A CT of the abd/pelvis was performed, but was unrevealing for any acute abnormality. GI was consulted by the EDP and a GI pathogen panel was ordered, but later returned negative. With administration of intravenous fluids, his hypotension resolved and renal function significantly improved. Diarrhea resolved and he has not had any further episodes since admission. Since GI pathogen panel is negative, he will be prescribed imodium to use PRN. He is tolerating a solid diet with out vomitng and feels ready for discharge home. He will follow up with Dr. Karilyn Cota early next week.   Procedures:  none  Consultations:  GI  Discharge Exam: Filed Vitals:   08/15/15 2236 08/16/15 0638  BP: 187/89 149/85  Pulse: 114 92  Temp: 98.4 F (36.9 C) 98.6 F (37 C)  Resp: 16 16    Examination:  General exam: Appears calm and comfortable  Respiratory system: Clear to auscultation. Respiratory effort normal. Cardiovascular system: S1 & S2 heard, RRR. No JVD, murmurs, rubs, gallops or clicks. No pedal edema. Gastrointestinal system: Abdomen is nondistended, soft and nontender. No organomegaly or masses felt. Normal bowel sounds heard. Central nervous system: Alert and oriented. No focal neurological deficits. Extremities: Symmetric 5 x 5 power. Skin: No rashes, lesions or ulcers Psychiatry: Judgement and insight appear normal. Mood & affect appropriate.  Discharge Instructions   Discharge Instructions    Diet - low sodium heart healthy    Complete by:  As directed      Increase activity slowly    Complete by:  As directed           Current Discharge Medication List    START taking these medications   Details  folic acid (FOLVITE) 1 MG tablet Take 1 tablet (1 mg total) by mouth  daily. Qty: 30 tablet, Refills: 0      CONTINUE these medications which have NOT CHANGED   Details  Ca Carbonate-Mag Hydroxide (ROLAIDS PO) Take 1 tablet by mouth daily.     dexlansoprazole (DEXILANT) 60 MG capsule Take 1 capsule (60 mg total) by mouth daily before breakfast. Qty: 30 capsule, Refills: 5   Associated Diagnoses: Gastroesophageal reflux disease, esophagitis presence not specified    insulin glargine (LANTUS) 100 UNIT/ML injection Inject 60 Units into the skin 2 (two) times daily.    Insulin Glulisine (APIDRA SOLOSTAR) 100 UNIT/ML Solostar Pen Inject 18 Units into the skin 3 (three) times daily.    Liraglutide (VICTOZA) 18 MG/3ML SOPN Inject 1.8 mg into the skin daily.     metoCLOPramide (REGLAN) 10 MG tablet Take 10 mg by mouth 2 (two) times daily.    metoprolol tartrate (LOPRESSOR) 25 MG tablet Take 25 mg by mouth 2 (two) times daily.    Oxcarbazepine (TRILEPTAL) 300 MG tablet Take 300 mg by mouth 3 (three) times daily.    pentoxifylline (TRENTAL) 400 MG CR tablet Take 400 mg by mouth 3 (three) times daily with meals.    pravastatin (PRAVACHOL) 40 MG tablet Take 40 mg by mouth daily.    pregabalin (LYRICA) 300 MG capsule Take 300 mg by mouth 2 (two) times daily.    sulfaSALAzine (AZULFIDINE) 500 MG tablet Take 500 mg by mouth 2 (two) times daily.    tamsulosin (FLOMAX) 0.4 MG CAPS capsule Take 0.4 mg by mouth daily.    traMADol (ULTRAM) 50 MG tablet Take by mouth every 6 (six) hours as needed.    loperamide (IMODIUM) 2 MG capsule Take 1 capsule (2 mg total) by mouth 4 (four) times daily as needed for diarrhea or loose stools. Qty: 30 capsule, Refills: 5   Associated Diagnoses: Diarrhea, unspecified type       No Known Allergies Follow-up Information    Follow up with Paul December, MD.   Specialty:  Gastroenterology   Why:  call on monday for appointment   Contact information:   [address], [address] [place] [address] [phone]        The  results of significant diagnostics from this hospitalization (including imaging, microbiology, ancillary and laboratory) are listed below for reference.    Significant Diagnostic Studies: Ct Abdomen Pelvis Wo Contrast  08/14/2015  CLINICAL DATA:  Diarrhea for 2 weeks EXAM: CT ABDOMEN AND PELVIS WITHOUT CONTRAST TECHNIQUE: Multidetector CT imaging of the abdomen and pelvis was performed following the standard protocol without IV contrast. COMPARISON:  None. FINDINGS: Lung bases are free of acute infiltrate or sizable effusion. Calcified lymph nodes are noted in the left hilum consistent with prior granulomatous disease. The gallbladder has been surgically removed. The liver, adrenal glands and pancreas are within normal limits. The spleen demonstrates multiple calcified granulomas. The kidneys are well visualized without renal calculi or obstructive changes. The bladder is decompressed. No pelvic mass lesion is seen. The appendix is within normal limits. Large and small bowel are  within normal limits. No acute bony abnormality is seen. IMPRESSION: Changes of prior granulomatous disease. No other focal abnormality is noted. Electronically Signed   By: Alcide Clever M.D.   On: 08/14/2015 20:19   Dg Chest 2 View  08/14/2015  CLINICAL DATA:  Syncopal episode today. EXAM: CHEST  2 VIEW COMPARISON:  None. FINDINGS: Well-defined nodular density in the left upper lung is suggestive for a calcified granuloma. Otherwise, the lungs are clear. Heart and mediastinum are within normal limits. The trachea is midline. Surgical clips in the right lower neck. No large pleural effusions. IMPRESSION: No active cardiopulmonary disease. Electronically Signed   By: Richarda Overlie M.D.   On: 08/14/2015 18:48    Microbiology: Recent Results (from the past 240 hour(s))  Gastrointestinal Panel by PCR , Stool     Status: None   Collection Time: 08/14/15  8:00 PM  Result Value Ref Range Status   Campylobacter species NOT DETECTED NOT  DETECTED Final   Plesimonas shigelloides NOT DETECTED NOT DETECTED Final   Salmonella species NOT DETECTED NOT DETECTED Final   Yersinia enterocolitica NOT DETECTED NOT DETECTED Final   Vibrio species NOT DETECTED NOT DETECTED Final   Vibrio cholerae NOT DETECTED NOT DETECTED Final   Enteroaggregative E coli (EAEC) NOT DETECTED NOT DETECTED Final   Enteropathogenic E coli (EPEC) NOT DETECTED NOT DETECTED Final   Enterotoxigenic E coli (ETEC) NOT DETECTED NOT DETECTED Final   Shiga like toxin producing E coli (STEC) NOT DETECTED NOT DETECTED Final   E. coli O157 NOT DETECTED NOT DETECTED Final   Shigella/Enteroinvasive E coli (EIEC) NOT DETECTED NOT DETECTED Final   Cryptosporidium NOT DETECTED NOT DETECTED Final   Cyclospora cayetanensis NOT DETECTED NOT DETECTED Final   Entamoeba histolytica NOT DETECTED NOT DETECTED Final   Giardia lamblia NOT DETECTED NOT DETECTED Final   Adenovirus F40/41 NOT DETECTED NOT DETECTED Final   Astrovirus NOT DETECTED NOT DETECTED Final   Norovirus GI/GII NOT DETECTED NOT DETECTED Final   Rotavirus A NOT DETECTED NOT DETECTED Final   Sapovirus (I, II, IV, and V) NOT DETECTED NOT DETECTED Final  MRSA PCR Screening     Status: None   Collection Time: 08/15/15  2:39 AM  Result Value Ref Range Status   MRSA by PCR NEGATIVE NEGATIVE Final    Comment:        The GeneXpert MRSA Assay (FDA approved for NASAL specimens only), is one component of a comprehensive MRSA colonization surveillance program. It is not intended to diagnose MRSA infection nor to guide or monitor treatment for MRSA infections.      Labs: Basic Metabolic Panel:  Recent Labs Lab 08/14/15 1657 08/15/15 0413 08/16/15 0535  NA 129* 132* 133*  K 4.7 4.3 4.4  CL 99* 108 110  CO2 21* 16* 16*  GLUCOSE 291* 150* 217*  BUN 33* 37* 24*  CREATININE 3.15* 2.95* 1.65*  CALCIUM 8.9 7.8* 8.1*   Liver Function Tests:  Recent Labs Lab 08/14/15 1657  AST 17  ALT 20  ALKPHOS 89   BILITOT 0.4  PROT 8.1  ALBUMIN 4.4    Recent Labs Lab 08/14/15 1657  LIPASE 78*   No results for input(s): AMMONIA in the last 168 hours. CBC:  Recent Labs Lab 08/14/15 1657 08/16/15 0535  WBC 14.4* 9.4  NEUTROABS 9.7*  --   HGB 15.3 12.0*  HCT 46.1 35.5*  MCV 87.1 87.2  PLT 376 255   Cardiac Enzymes:  Recent Labs Lab 08/14/15 1657  TROPONINI <0.03    CBG:  Recent Labs Lab 08/15/15 1155 08/15/15 1616 08/15/15 2118 08/16/15 0757 08/16/15 1205  GLUCAP 184* 149* 152* 141* 93    Signed: Erick BlinksJehanzeb Paulo Keimig, MD.  [facility] 08/16/2015, 12:53 PM  By signing my name below, I, Adron BeneGreylon Gawaluck, attest that this documentation has been prepared under the direction and in the presence of Erick BlinksJehanzeb Corday Wyka, MD. Electronically Signed: Adron BeneGreylon Gawaluck 08/16/2015 12:00pm  I, Dr. Erick BlinksJehanzeb Roan Miklos, personally performed the services described in this documentaiton. All medical record entries made by the scribe were at my direction and in my presence. I have reviewed the chart and agree that the record reflects my personal performance and is accurate and complete  Erick BlinksJehanzeb Rondall Radigan, MD, 08/16/2015 12:53 PM

## 2015-08-16 NOTE — Progress Notes (Signed)
Patient without complaints states diarrhea has resolved. He is tolerating a soft diet.  Discussed with Dr. Kerry HoughMemon earlier this morning.    Vital signs in last 24 hours: Temp:  [96.4 F (35.8 C)-98.6 F (37 C)] 98.6 F (37 C) (06/17 [id number]) Pulse Rate:  [92-114] 92 (06/17 0638) Resp:  [16] 16 (06/17 0638) BP: (149-187)/(85-89) 149/85 mmHg (06/17 0638) SpO2:  [98 %] 98 % (06/17 [id number]) Last BM Date: 08/15/15 General:   Alert,   pleasant and cooperative in NAD Abdomen:  Obese. Positive bowel sounds soft and nontender Extremities:  Without clubbing or edema.    Intake/Output from previous day: 06/16 0701 - 06/17 0700 In: 615 [P.O.:240; I.V.:375] Out: -  Intake/Output this shift:    Lab Results:  Recent Labs  08/14/15 1657 08/16/15 0535  WBC 14.4* 9.4  HGB 15.3 12.0*  HCT 46.1 35.5*  PLT 376 255   BMET  Recent Labs  08/14/15 1657 08/15/15 0413 08/16/15 0535  NA 129* 132* 133*  K 4.7 4.3 4.4  CL 99* 108 110  CO2 21* 16* 16*  GLUCOSE 291* 150* 217*  BUN 33* 37* 24*  CREATININE 3.15* 2.95* 1.65*  CALCIUM 8.9 7.8* 8.1*   LFT  Recent Labs  08/14/15 1657  PROT 8.1  ALBUMIN 4.4  AST 17  ALT 20  ALKPHOS 89  BILITOT 0.4   PT/INR  Recent Labs  08/15/15 0413  LABPROT 15.7*  INR 1.24   Hepatitis Panel No results for input(s): HEPBSAG, HCVAB, HEPAIGM, HEPBIGM in the last 72 hours. C-Diff No results for input(s): CDIFFTOX in the last 72 hours.  Studies/Results: Ct Abdomen Pelvis Wo Contrast  08/14/2015  CLINICAL DATA:  Diarrhea for 2 weeks EXAM: CT ABDOMEN AND PELVIS WITHOUT CONTRAST TECHNIQUE: Multidetector CT imaging of the abdomen and pelvis was performed following the standard protocol without IV contrast. COMPARISON:  None. FINDINGS: Lung bases are free of acute infiltrate or sizable effusion. Calcified lymph nodes are noted in the left hilum consistent with prior granulomatous disease. The gallbladder has been surgically removed. The liver, adrenal  glands and pancreas are within normal limits. The spleen demonstrates multiple calcified granulomas. The kidneys are well visualized without renal calculi or obstructive changes. The bladder is decompressed. No pelvic mass lesion is seen. The appendix is within normal limits. Large and small bowel are within normal limits. No acute bony abnormality is seen. IMPRESSION: Changes of prior granulomatous disease. No other focal abnormality is noted. Electronically Signed   By: Alcide CleverMark  Lukens M.D.   On: 08/14/2015 20:19   Dg Chest 2 View  08/14/2015  CLINICAL DATA:  Syncopal episode today. EXAM: CHEST  2 VIEW COMPARISON:  None. FINDINGS: Well-defined nodular density in the left upper lung is suggestive for a calcified granuloma. Otherwise, the lungs are clear. Heart and mediastinum are within normal limits. The trachea is midline. Surgical clips in the right lower neck. No large pleural effusions. IMPRESSION: No active cardiopulmonary disease. Electronically Signed   By: Richarda OverlieAdam  Henn M.D.   On: 08/14/2015 18:48    Impression:  Acute diarrheal illness-resolving. History of Crohn's disease. History of GERD / dysphagia   Recommendations:  From a GI standpoint, can probably be discharged later today  He should go home on Dexilant 60 mg daily - benefits outweigh any risks as discussed  Okay to go home on Reglan as well until he is reassessed by Dr. Karilyn Cotaehman in the office  Continue sulfasalazine;  add folic acid supplementation  Imodium when necessary is  also okay.

## 2015-08-19 ENCOUNTER — Encounter (HOSPITAL_COMMUNITY)
Admission: RE | Admit: 2015-08-19 | Discharge: 2015-08-19 | Disposition: A | Discharge status: Home / Self Care | Payer: Medicare Other | Source: Ambulatory Visit | Attending: Internal Medicine | Admitting: Internal Medicine

## 2015-08-19 ENCOUNTER — Other Ambulatory Visit (INDEPENDENT_AMBULATORY_CARE_PROVIDER_SITE_OTHER): Payer: Self-pay | Admitting: Internal Medicine

## 2015-08-19 ENCOUNTER — Encounter (HOSPITAL_COMMUNITY): Payer: Self-pay | Admitting: *Deleted

## 2015-08-19 DIAGNOSIS — R1310 Dysphagia, unspecified: Secondary | ICD-10-CM

## 2015-08-20 ENCOUNTER — Encounter (HOSPITAL_COMMUNITY): Payer: Self-pay | Admitting: *Deleted

## 2015-08-20 ENCOUNTER — Ambulatory Visit (HOSPITAL_COMMUNITY)
Admission: RE | Admit: 2015-08-20 | Discharge: 2015-08-20 | Disposition: A | Discharge status: Home / Self Care | Payer: BLUE CROSS/BLUE SHIELD | Source: Ambulatory Visit | Attending: Internal Medicine | Admitting: Internal Medicine

## 2015-08-20 ENCOUNTER — Ambulatory Visit (HOSPITAL_COMMUNITY): Payer: BLUE CROSS/BLUE SHIELD | Admitting: Anesthesiology

## 2015-08-20 ENCOUNTER — Ambulatory Visit (HOSPITAL_COMMUNITY)
Admission: RE | Disposition: A | Discharge status: Home / Self Care | Payer: BLUE CROSS/BLUE SHIELD | Source: Ambulatory Visit | Attending: Internal Medicine

## 2015-08-20 DIAGNOSIS — I10 Essential (primary) hypertension: Secondary | ICD-10-CM | POA: Diagnosis not present

## 2015-08-20 DIAGNOSIS — E782 Mixed hyperlipidemia: Secondary | ICD-10-CM | POA: Insufficient documentation

## 2015-08-20 DIAGNOSIS — R1314 Dysphagia, pharyngoesophageal phase: Secondary | ICD-10-CM

## 2015-08-20 DIAGNOSIS — R1319 Other dysphagia: Secondary | ICD-10-CM | POA: Insufficient documentation

## 2015-08-20 DIAGNOSIS — Z8673 Personal history of transient ischemic attack (TIA), and cerebral infarction without residual deficits: Secondary | ICD-10-CM | POA: Insufficient documentation

## 2015-08-20 DIAGNOSIS — K3189 Other diseases of stomach and duodenum: Secondary | ICD-10-CM

## 2015-08-20 DIAGNOSIS — Z79899 Other long term (current) drug therapy: Secondary | ICD-10-CM | POA: Diagnosis not present

## 2015-08-20 DIAGNOSIS — K228 Other specified diseases of esophagus: Secondary | ICD-10-CM

## 2015-08-20 DIAGNOSIS — I2510 Atherosclerotic heart disease of native coronary artery without angina pectoris: Secondary | ICD-10-CM | POA: Insufficient documentation

## 2015-08-20 DIAGNOSIS — K317 Polyp of stomach and duodenum: Secondary | ICD-10-CM | POA: Diagnosis not present

## 2015-08-20 DIAGNOSIS — Z794 Long term (current) use of insulin: Secondary | ICD-10-CM | POA: Diagnosis not present

## 2015-08-20 DIAGNOSIS — Z87891 Personal history of nicotine dependence: Secondary | ICD-10-CM | POA: Insufficient documentation

## 2015-08-20 DIAGNOSIS — E1121 Type 2 diabetes mellitus with diabetic nephropathy: Secondary | ICD-10-CM | POA: Diagnosis not present

## 2015-08-20 DIAGNOSIS — K210 Gastro-esophageal reflux disease with esophagitis: Secondary | ICD-10-CM

## 2015-08-20 DIAGNOSIS — R1310 Dysphagia, unspecified: Secondary | ICD-10-CM

## 2015-08-20 HISTORY — PX: ESOPHAGOGASTRODUODENOSCOPY (EGD) WITH PROPOFOL: SHX5813

## 2015-08-20 HISTORY — PX: ESOPHAGEAL DILATION: SHX303

## 2015-08-20 LAB — GLUCOSE, CAPILLARY
GLUCOSE-CAPILLARY: 130 mg/dL — AB (ref 65–99)
Glucose-Capillary: 67 mg/dL (ref 65–99)
Glucose-Capillary: 99 mg/dL (ref 65–99)

## 2015-08-20 SURGERY — ESOPHAGOGASTRODUODENOSCOPY (EGD) WITH PROPOFOL
Anesthesia: Monitor Anesthesia Care

## 2015-08-20 MED ORDER — ONDANSETRON HCL 4 MG/2ML IJ SOLN
INTRAMUSCULAR | Status: AC
  Filled 2015-08-20: qty 2

## 2015-08-20 MED ORDER — FENTANYL CITRATE (PF) 100 MCG/2ML IJ SOLN
25.0000 ug | INTRAMUSCULAR | Status: DC | PRN
  Administered 2015-08-20: 25 ug via INTRAVENOUS

## 2015-08-20 MED ORDER — BUTAMBEN-TETRACAINE-BENZOCAINE 2-2-14 % EX AERO
2.0000 | INHALATION_SPRAY | Freq: Once | CUTANEOUS | Status: AC
  Administered 2015-08-20: 2 via TOPICAL

## 2015-08-20 MED ORDER — PROPOFOL 500 MG/50ML IV EMUL
INTRAVENOUS | Status: DC | PRN
  Administered 2015-08-20: 125 ug/kg/min via INTRAVENOUS

## 2015-08-20 MED ORDER — PROPOFOL 10 MG/ML IV BOLUS
INTRAVENOUS | Status: AC
  Filled 2015-08-20: qty 40

## 2015-08-20 MED ORDER — ONDANSETRON HCL 4 MG/2ML IJ SOLN
4.0000 mg | Freq: Once | INTRAMUSCULAR | Status: AC
  Administered 2015-08-20: 4 mg via INTRAVENOUS

## 2015-08-20 MED ORDER — DEXTROSE 50 % IV SOLN
1.0000 | Freq: Once | INTRAVENOUS | Status: AC
  Administered 2015-08-20: 50 mL via INTRAVENOUS

## 2015-08-20 MED ORDER — ONDANSETRON 4 MG PO TBDP: 4.0000 mg | ORAL_TABLET | Freq: Three times a day (TID) | ORAL | Status: AC | PRN

## 2015-08-20 MED ORDER — LACTATED RINGERS IV SOLN
INTRAVENOUS | Status: DC
  Administered 2015-08-20: 08:00:00 via INTRAVENOUS

## 2015-08-20 MED ORDER — SULFASALAZINE 500 MG PO TABS: 500.0000 mg | ORAL_TABLET | Freq: Two times a day (BID) | ORAL | Status: AC

## 2015-08-20 MED ORDER — MIDAZOLAM HCL 2 MG/2ML IJ SOLN
INTRAMUSCULAR | Status: AC
  Filled 2015-08-20: qty 2

## 2015-08-20 MED ORDER — LIDOCAINE HCL (PF) 1 % IJ SOLN
INTRAMUSCULAR | Status: AC
  Filled 2015-08-20: qty 5

## 2015-08-20 MED ORDER — PROPOFOL 10 MG/ML IV BOLUS
INTRAVENOUS | Status: DC | PRN
  Administered 2015-08-20: 10 mg via INTRAVENOUS

## 2015-08-20 MED ORDER — FENTANYL CITRATE (PF) 100 MCG/2ML IJ SOLN
INTRAMUSCULAR | Status: AC
  Filled 2015-08-20: qty 2

## 2015-08-20 MED ORDER — DEXTROSE 50 % IV SOLN
INTRAVENOUS | Status: AC
  Filled 2015-08-20: qty 50

## 2015-08-20 MED ORDER — MIDAZOLAM HCL 2 MG/2ML IJ SOLN
1.0000 mg | INTRAMUSCULAR | Status: DC | PRN
Start: 2015-08-20 — End: 2015-08-20
  Administered 2015-08-20: 2 mg via INTRAVENOUS

## 2015-08-20 NOTE — Transfer of Care (Signed)
Immediate Anesthesia Transfer of Care Note  Patient: Paul Wilcox  Procedure(s) Performed: Procedure(s) with comments: ESOPHAGOGASTRODUODENOSCOPY (EGD) WITH PROPOFOL (N/A) - 730 ESOPHAGEAL DILATION (N/A)  Patient Location: PACU  Anesthesia Type:MAC  Level of Consciousness: awake and alert   Airway & Oxygen Therapy: Patient Spontanous Breathing and Patient connected to nasal cannula oxygen  Post-op Assessment: Report given to RN  Post vital signs: Reviewed and stable  Last Vitals:  Filed Vitals:   08/20/15 0715 08/20/15 0720  BP: 144/87 158/90  Temp:    Resp: 20 14    Last Pain: There were no vitals filed for this visit.    Patients Stated Pain Goal: 8 (08/20/15 [id number])  Complications: No apparent anesthesia complications

## 2015-08-20 NOTE — Anesthesia Preprocedure Evaluation (Signed)
Anesthesia Evaluation  Patient identified by MRN, date of birth, ID band Patient awake    Reviewed: Allergy & Precautions, NPO status , Patient's Chart, lab work & pertinent test results  Airway Mallampati: III  TM Distance: >3 FB     Dental  (+) Edentulous Upper, Partial Lower   Pulmonary former smoker,    breath sounds clear to auscultation       Cardiovascular hypertension, Pt. on medications + CAD   Rhythm:Regular Rate:Normal     Neuro/Psych CVA, No Residual Symptoms    GI/Hepatic GERD  Medicated and Controlled,  Endo/Other  diabetes, Type 2, Insulin Dependent  Renal/GU Renal InsufficiencyRenal disease     Musculoskeletal   Abdominal   Peds  Hematology   Anesthesia Other Findings   Reproductive/Obstetrics                             Anesthesia Physical Anesthesia Plan  ASA: III  Anesthesia Plan: MAC   Post-op Pain Management:    Induction: Intravenous  Airway Management Planned: Simple Face Mask  Additional Equipment:   Intra-op Plan:   Post-operative Plan:   Informed Consent: I have reviewed the patients History and Physical, chart, labs and discussed the procedure including the risks, benefits and alternatives for the proposed anesthesia with the patient or authorized representative who has indicated his/her understanding and acceptance.     Plan Discussed with:   Anesthesia Plan Comments:         Anesthesia Quick Evaluation

## 2015-08-20 NOTE — Discharge Instructions (Addendum)
Resume usual medications and diet. Can resume clopidogrel on 08/21/2015. No driving for 24 hours. Physician will call with biopsy results next week. Esophagogastroduodenoscopy, Care After Refer to this sheet in the next few weeks. These instructions provide you with information about caring for yourself after your procedure. Your health care provider may also give you more specific instructions. Your treatment has been planned according to current medical practices, but problems sometimes occur. Call your health care provider if you have any problems or questions after your procedure. WHAT TO EXPECT AFTER THE PROCEDURE After your procedure, it is typical to feel:  Soreness in your throat.  Pain with swallowing.  Sick to your stomach (nauseous).  Bloated.  Dizzy.  Fatigued. HOME CARE INSTRUCTIONS  Do not eat or drink anything until the numbing medicine (local anesthetic) has worn off and your gag reflex has returned. You will know that the local anesthetic has worn off when you can swallow comfortably.  Do not drive or operate machinery until directed by your health care provider.  Take medicines only as directed by your health care provider. SEEK MEDICAL CARE IF:   You cannot stop coughing.  You are not urinating at all or less than usual. SEEK IMMEDIATE MEDICAL CARE IF:  You have difficulty swallowing.  You cannot eat or drink.  You have worsening throat or chest pain.  You have dizziness or lightheadedness or you faint.  You have nausea or vomiting.  You have chills.  You have a fever.  You have severe abdominal pain.  You have black, tarry, or bloody stools.   This information is not intended to replace advice given to you by your health care provider. Make sure you discuss any questions you have with your health care provider.   Monitored Anesthesia Care Monitored anesthesia care is an anesthesia service for a medical procedure. Anesthesia is the loss of the  ability to feel pain. It is produced by medicines called anesthetics. It may affect a small area of your body (local anesthesia), a large area of your body (regional anesthesia), or your entire body (general anesthesia). The need for monitored anesthesia care depends your procedure, your condition, and the potential need for regional or general anesthesia. It is often provided during procedures where:   General anesthesia may be needed if there are complications. This is because you need special care when you are under general anesthesia.   You will be under local or regional anesthesia. This is so that you are able to have higher levels of anesthesia if needed.   You will receive calming medicines (sedatives). This is especially the case if sedatives are given to put you in a semi-conscious state of relaxation (deep sedation). This is because the amount of sedative needed to produce this state can be hard to predict. Too much of a sedative can produce general anesthesia. Monitored anesthesia care is performed by one or more health care providers who have special training in all types of anesthesia. You will need to meet with these health care providers before your procedure. During this meeting, they will ask you about your medical history. They will also give you instructions to follow. (For example, you will need to stop eating and drinking before your procedure. You may also need to stop or change medicines you are taking.) During your procedure, your health care providers will stay with you. They will:   Watch your condition. This includes watching your blood pressure, breathing, and level of pain.  Diagnose and treat problems that occur.   Give medicines if they are needed. These may include calming medicines (sedatives) and anesthetics.   Make sure you are comfortable.  Having monitored anesthesia care does not necessarily mean that you will be under anesthesia. It does mean that your  health care providers will be able to manage anesthesia if you need it or if it occurs. It also means that you will be able to have a different type of anesthesia than you are having if you need it. When your procedure is complete, your health care providers will continue to watch your condition. They will make sure any medicines wear off before you are allowed to go home.    This information is not intended to replace advice given to you by your health care provider. Make sure you discuss any questions you have with your health care provider.   Document Released: 11/11/2004 Document Revised: 03/08/2014 Document Reviewed: 03/29/2012 Elsevier Interactive Patient Education [employer]2016 [employer]

## 2015-08-20 NOTE — Op Note (Addendum)
[facility] Patient Name: Paul Wilcox Procedure Date: 08/20/2015 7:25 AM MRN: [id number] Date of Birth: 1956/01/21 Attending MD: Lionel December , MD CSN: [id number] Age: 60 Admit Type: Outpatient Procedure:                Upper GI endoscopy Indications:              Esophageal dysphagia, Gastro-esophageal reflux                            disease Providers:                Lionel December, MD, Loma Messing B. Patsy Lager, RN, Burke Keels, Technician Referring MD:             [place], [facility] Medicines:                Propofol per Anesthesia Complications:            No immediate complications. Estimated Blood Loss:     Estimated blood loss was minimal. Procedure:                Pre-Anesthesia Assessment:                           - Prior to the procedure, a History and Physical                            was performed, and patient medications and                            allergies were reviewed. The patient's tolerance of                            previous anesthesia was also reviewed. The risks                            and benefits of the procedure and the sedation                            options and risks were discussed with the patient.                            All questions were answered, and informed consent                            was obtained. Prior Anticoagulants: The patient                            last took previous NSAID medication 6 days prior to                            the procedure. ASA Grade Assessment: III - A  patient with severe systemic disease. After                            reviewing the risks and benefits, the patient was                            deemed in satisfactory condition to undergo the                            procedure.                           After obtaining informed consent, the endoscope was                            passed under direct vision. Throughout the                  procedure, the patient's blood pressure, pulse, and                            oxygen saturations were monitored continuously. The                            EG-[id number] ([id number]) scope was introduced through the                            mouth, and advanced to the second part of duodenum.                            The upper GI endoscopy was accomplished without                            difficulty. The patient tolerated the procedure                            well. Scope In: 7:47:53 AM Scope Out: 8:08:07 AM Total Procedure Duration: 0 hours 20 minutes 14 seconds  Findings:      The upper third of the esophagus and middle third of the esophagus were       normal.      LA Grade A (one or more mucosal breaks less than 5 mm, not extending       between tops of 2 mucosal folds) esophagitis with no bleeding was found       43 cm from the incisors.      The Z-line was irregular and was found 44 cm from the incisors.      No endoscopic abnormality was evident in the esophagus to explain the       patient's complaint of dysphagia. It was decided, however, to proceed       with dilation of the entire esophagus. The scope was withdrawn. Dilation       was performed with a Maloney dilator with no resistance at 54 Fr and 56       Fr. The dilation site was examined following endoscope reinsertion and       showed no change.      Three  small sessile polyps with no bleeding and no stigmata of recent       bleeding were found in the gastric antrum. Biopsies were taken with a       cold forceps for histology.      Localized nodular mucosa was found in the prepyloric region of the       stomach. Biopsies were taken with a cold forceps for histology.      The exam of the stomach was otherwise normal.      The duodenal bulb was normal.      Patchy mucosal flattening was found in the second portion of the       duodenum. Biopsies were taken with a cold forceps for histology. Impression:                - Normal upper third of esophagus and middle third                            of esophagus.                           - LA Grade A reflux esophagitis.                           - Z-line irregular, 44 cm from the incisors.                           - No endoscopic esophageal abnormality to explain                            patient's dysphagia. Esophagus dilated. Dilated.                           - Three gastric polyps. Biopsied.                           - Nodular mucosa in the prepyloric region of the                            stomach. Biopsied (possiblyresidual changes due to                            healed ulcer).                           - Normal duodenal bulb.                           - Flattened mucosa was found in the duodenum.                            Biopsied. Moderate Sedation:      Per Anesthesia Care      Per Anesthesia Care Recommendation:           - Patient has a contact number available for                            emergencies. The signs and symptoms  of potential                            delayed complications were discussed with the                            patient. Return to normal activities tomorrow.                            Written discharge instructions were provided to the                            patient.                           - Resume previous diet today.                           - Continue present medications.                           - Resume Plavix (clopidogrel) at prior dose                            tomorrow.                           - Await pathology results.                           - Return to GI office in 3 months. Procedure Code(s):        --- Professional ---                           [phone], Esophagogastroduodenoscopy, flexible,                            transoral; with biopsy, single or multiple                           43450, Dilation of esophagus, by unguided sound or                            bougie,  single or multiple passes Diagnosis Code(s):        --- Professional ---                           K21.0, Gastro-esophageal reflux disease with                            esophagitis                           K22.8, Other specified diseases of esophagus                           K31.7, Polyp of stomach and duodenum  K31.89, Other diseases of stomach and duodenum                           R13.14, Dysphagia, pharyngoesophageal phase CPT copyright 2016 [employer]. All rights reserved. The codes documented in this report are preliminary and upon coder review may  be revised to meet current compliance requirements. Lionel DecemberNajeeb Rehman, MD Lionel DecemberNajeeb Rehman, MD 08/20/2015 8:38:06 AM This report has been signed electronically. Number of Addenda: 0

## 2015-08-20 NOTE — Anesthesia Postprocedure Evaluation (Signed)
Anesthesia Post Note  Patient: Marlowe Saxnthony F Mckelvin  Procedure(s) Performed: Procedure(s) (LRB): ESOPHAGOGASTRODUODENOSCOPY (EGD) WITH PROPOFOL (N/A) ESOPHAGEAL DILATION (N/A)  Patient location during evaluation: PACU Anesthesia Type: MAC Level of consciousness: awake and alert Pain management: pain level controlled Vital Signs Assessment: post-procedure vital signs reviewed and stable Respiratory status: spontaneous breathing Cardiovascular status: blood pressure returned to baseline Postop Assessment: no signs of nausea or vomiting Anesthetic complications: no    Last Vitals:  Filed Vitals:   08/20/15 0818 08/20/15 0830  BP: 123/78 140/87  Temp: 36.8 C   Resp: 18 20    Last Pain: There were no vitals filed for this visit.               Vanity Larsson

## 2015-08-20 NOTE — H&P (Signed)
Paul Wilcox is an 60 y.o. male.   Chief Complaint: Patient is here for EGD and ED. HPI: Patient is 60 year old Caucasian male was multiple medical problems and was seen in the office last week for dysphagia lump in throat daily heartburn as well as copious nonbloody diarrhea. He also has history of Crohn's disease but has been remission for 7 years on a low dose sulfasalazine. He was noted to be hypotensive and sent over to emergency room. He was noted to have acute renal injury. He was hospitalized and hydrated. Regarding diarrhea he had GI pathogen panel which was negative. Diarrhea resolved with symptomatic therapy. He is now returning for diagnostic and therapeutic EGD. He states he had normal bowel movement 2 days ago and did not have any yesterday. He is a good appetite. He supposed to be seen as new primary care physician tomorrow. He has not been taking Plavix. He states she's been off for several days.  Past Medical History  Diagnosis Date  . Mixed hyperlipidemia   . Hypertension     Beign essential  . Microalbuminuria   . Ischemic stroke (HCC)   . Coronary artery disease due to calcified coronary lesion   . B12 deficiency   . GERD (gastroesophageal reflux disease)   . Hypomagnesemia   . Long term current use of antithrombotics/antiplatelets   . Hypomagnesemia   . Encounter for long-term current use of medication   . Crohn's colitis (HCC)   . Type 2 diabetes mellitus with diabetic nephropathy, with long-term current use of insulin (HCC)   . ARF (acute renal failure) (HCC) 04/2015    [facility]) due to dehydration from diarrhea    Past Surgical History  Procedure Laterality Date  . Dg great toe left foot      Removal  . Cholecystectomy    . Neck mass excision      Tumor - non- Malignant  . Colonoscopy      [facility]. Three years ago    Family History  Problem Relation Age of Onset  . Diabetes Sister   . Bipolar disorder Sister   . Hypertension Sister   .  Stroke Sister   . Arthritis Brother   . Stroke Sister   . Healthy Daughter    Social History:  reports that he has quit smoking. His smoking use included Cigarettes. He started smoking about 5 years ago. He has a 25 pack-year smoking history. His smokeless tobacco use includes Snuff. He reports that he does not drink alcohol or use illicit drugs.  Allergies: No Known Allergies  Medications Prior to Admission  Medication Sig Dispense Refill  . Ca Carbonate-Mag Hydroxide (ROLAIDS PO) Take 1 tablet by mouth daily.     Marland Kitchen dexlansoprazole (DEXILANT) 60 MG capsule Take 1 capsule (60 mg total) by mouth daily before breakfast. 30 capsule 5  . folic acid (FOLVITE) 1 MG tablet Take 1 tablet (1 mg total) by mouth daily. 30 tablet 0  . insulin glargine (LANTUS) 100 UNIT/ML injection Inject 60 Units into the skin 2 (two) times daily.    . Insulin Glulisine (APIDRA SOLOSTAR) 100 UNIT/ML Solostar Pen Inject 18 Units into the skin 3 (three) times daily.    . Liraglutide (VICTOZA) 18 MG/3ML SOPN Inject 1.8 mg into the skin daily.     Marland Kitchen loperamide (IMODIUM) 2 MG capsule Take 1 capsule (2 mg total) by mouth 4 (four) times daily as needed for diarrhea or loose stools. 30 capsule 5  . metoCLOPramide (REGLAN)  10 MG tablet Take 10 mg by mouth 2 (two) times daily.    . metoprolol tartrate (LOPRESSOR) 25 MG tablet Take 25 mg by mouth 2 (two) times daily.    . Oxcarbazepine (TRILEPTAL) 300 MG tablet Take 300 mg by mouth 3 (three) times daily.    . pentoxifylline (TRENTAL) 400 MG CR tablet Take 400 mg by mouth 3 (three) times daily with meals.    . pravastatin (PRAVACHOL) 40 MG tablet Take 40 mg by mouth daily.    . pregabalin (LYRICA) 300 MG capsule Take 300 mg by mouth 2 (two) times daily.    Marland Kitchen. sulfaSALAzine (AZULFIDINE) 500 MG tablet Take 500 mg by mouth 2 (two) times daily.    . tamsulosin (FLOMAX) 0.4 MG CAPS capsule Take 0.4 mg by mouth daily.    . traMADol (ULTRAM) 50 MG tablet Take by mouth every 6 (six) hours  as needed.      Results for orders placed or performed during the hospital encounter of 08/20/15 (from the past 48 hour(s))  Glucose, capillary     Status: None   Collection Time: 08/20/15  6:44 AM  Result Value Ref Range   Glucose-Capillary 67 65 - 99 mg/dL   No results found.  ROS  Temperature 98.7 F (37.1 C), temperature source Oral. Physical Exam  Constitutional: He appears well-developed and well-nourished.  HENT:  Mouth/Throat: Oropharynx is clear and moist.  Eyes: Conjunctivae are normal. No scleral icterus.  Neck: No thyromegaly present.  Cardiovascular: Normal rate, regular rhythm and normal heart sounds.   No murmur heard. Respiratory: Effort normal and breath sounds normal.  GI: He exhibits no distension and no mass. There is no tenderness. There is no rebound.  Musculoskeletal: He exhibits no edema.  Lymphadenopathy:    He has no cervical adenopathy.  Neurological: He is alert.  Skin: Skin is warm and dry.     Assessment/Plan Solid food dysphagia. Chronic GERD with poorly controlled's symptoms. EGD with ED under monitored anesthesia care.  Lionel DecemberNajeeb Rehman, MD 08/20/2015, 7:22 AM

## 2015-08-26 ENCOUNTER — Encounter (HOSPITAL_COMMUNITY): Payer: Self-pay | Admitting: Internal Medicine

## 2015-09-03 ENCOUNTER — Encounter (INDEPENDENT_AMBULATORY_CARE_PROVIDER_SITE_OTHER): Payer: Self-pay | Admitting: Internal Medicine

## 2015-09-03 NOTE — Progress Notes (Signed)
Patient was given an appointment for 10/15/15 at 2:15pm with Dorene Arerri Setzer.

## 2015-10-15 ENCOUNTER — Encounter (INDEPENDENT_AMBULATORY_CARE_PROVIDER_SITE_OTHER): Payer: Self-pay | Admitting: Internal Medicine

## 2015-10-15 ENCOUNTER — Ambulatory Visit (INDEPENDENT_AMBULATORY_CARE_PROVIDER_SITE_OTHER): Payer: BLUE CROSS/BLUE SHIELD | Admitting: Internal Medicine

## 2015-10-15 ENCOUNTER — Encounter (INDEPENDENT_AMBULATORY_CARE_PROVIDER_SITE_OTHER): Payer: Self-pay | Admitting: *Deleted

## 2015-10-15 ENCOUNTER — Ambulatory Visit (HOSPITAL_COMMUNITY)
Admission: RE | Admit: 2015-10-15 | Discharge: 2015-10-15 | Disposition: A | Discharge status: Home / Self Care | Payer: BLUE CROSS/BLUE SHIELD | Source: Ambulatory Visit | Attending: Internal Medicine | Admitting: Internal Medicine

## 2015-10-15 VITALS — BP 90/58 | HR 80 | Temp 97.5°F | Ht 72.0 in | Wt 257.7 lb

## 2015-10-15 DIAGNOSIS — R490 Dysphonia: Secondary | ICD-10-CM

## 2015-10-15 DIAGNOSIS — R1310 Dysphagia, unspecified: Secondary | ICD-10-CM

## 2015-10-15 DIAGNOSIS — J8410 Pulmonary fibrosis, unspecified: Secondary | ICD-10-CM | POA: Diagnosis not present

## 2015-10-15 NOTE — Progress Notes (Signed)
Subjective:    Patient ID: Paul Wilcox, male    DOB: 07/18/1955, 60 y.o.   MRN: [id number]  HPI  Here today for f/u. Underwent an EGD in June for dysphagia. He tells me he continues to have trouble swallowing. He says it feels like something is stuck in his throat. The longer he talks the worse it gets.  His appetite is good. There has been no weight loss. He has a BM about twice a day. Today he states he has had some diarrhea x 3 days. Has taken Imodium for this. Hx Crohn's for greater than 25 yrs and is maintained on Sulfasalazine 500mg  BID.  He has hoarseness. He has not smoked in 3 year.  Smoked for 20 years.      08/20/2015   Upper GI endoscopy: Dr. Karilyn Cota Indications:              Esophageal dysphagia, Gastro-esophageal reflux                            disease Impression:               - Normal upper third of esophagus and middle third                            of esophagus.                           - LA Grade A reflux esophagitis.                           - Z-line irregular, 44 cm from the incisors.                           - No endoscopic esophageal abnormality to explain                            patient's dysphagia. Esophagus dilated. Dilated.                           - Three gastric polyps. Biopsied.                           - Nodular mucosa in the prepyloric region of the                            stomach. Biopsied (possiblyresidual changes due to                            healed ulcer).                           - Normal duodenal bulb.                           - Flattened mucosa was found in the duodenum.                            Biopsy: Benign  Reactive gastric mucosa, minimal chronic inflammation.  He is still having lump in his throat but diarrhea is gone. He just started taking Dexlansoprazole.  He states [facility] and this exam was normal. His physicians decided to leave him on sulfasalazine since  he has done so well. However he's only been taking 500 mg twice daily.  Review of Systems Past Medical History:  Diagnosis Date  . ARF (acute renal failure) (HCC) 04/2015   [facility]) due to dehydration from diarrhea  . B12 deficiency   . Coronary artery disease due to calcified coronary lesion   . Crohn's colitis (HCC)   . Encounter for long-term current use of medication   . GERD (gastroesophageal reflux disease)   . Hypertension    Beign essential  . Hypomagnesemia   . Hypomagnesemia   . Ischemic stroke (HCC)   . Long term current use of antithrombotics/antiplatelets   . Microalbuminuria   . Mixed hyperlipidemia   . Type 2 diabetes mellitus with diabetic nephropathy, with long-term current use of insulin [facility](HCC)     Past Surgical History:  Procedure Laterality Date  . CHOLECYSTECTOMY    . COLONOSCOPY     [facility]. Three years ago  . DG GREAT TOE LEFT FOOT     Removal  . ESOPHAGEAL DILATION N/A 08/20/2015   Procedure: ESOPHAGEAL DILATION;  Surgeon: Malissa HippoNajeeb U Rehman, MD;  Location: [facility];  Service: Endoscopy;  Laterality: N/A;  . ESOPHAGOGASTRODUODENOSCOPY (EGD) WITH PROPOFOL N/A 08/20/2015   Procedure: ESOPHAGOGASTRODUODENOSCOPY (EGD) WITH PROPOFOL;  Surgeon: Malissa HippoNajeeb U Rehman, MD;  Location: [facility]ITE;  Service: Endoscopy;  Laterality: N/A;  730  . NECK MASS EXCISION     Tumor - non- Malignant    No Known Allergies  Current Outpatient Prescriptions on File Prior to Visit  Medication Sig Dispense Refill  . Ca Carbonate-Mag Hydroxide (ROLAIDS PO) Take 1 tablet by mouth daily.     Marland Kitchen. dexlansoprazole (DEXILANT) 60 MG capsule Take 1 capsule (60 mg total) by mouth daily before breakfast. 30 capsule 5  . insulin glargine (LANTUS) 100 UNIT/ML injection Inject 60 Units into the skin 2 (two) times daily.    . Insulin Glulisine (APIDRA SOLOSTAR) 100 UNIT/ML Solostar Pen Inject 18 Units into the skin 2 (two) times daily.     . Liraglutide (VICTOZA) 18 MG/3ML SOPN  Inject 1.8 mg into the skin daily.     Marland Kitchen. loperamide (IMODIUM) 2 MG capsule Take 1 capsule (2 mg total) by mouth 4 (four) times daily as needed for diarrhea or loose stools. 30 capsule 5  . metoCLOPramide (REGLAN) 10 MG tablet Take 10 mg by mouth 2 (two) times daily.    . metoprolol tartrate (LOPRESSOR) 25 MG tablet Take 25 mg by mouth 2 (two) times daily.    . ondansetron (ZOFRAN-ODT) 4 MG disintegrating tablet Take 1 tablet (4 mg total) by mouth every 8 (eight) hours as needed for nausea or vomiting. 30 tablet 0  . Oxcarbazepine (TRILEPTAL) 300 MG tablet Take 300 mg by mouth 3 (three) times daily.    . pentoxifylline (TRENTAL) 400 MG CR tablet Take 400 mg by mouth 3 (three) times daily with meals.    . pravastatin (PRAVACHOL) 40 MG tablet Take 40 mg by mouth daily.    . pregabalin (LYRICA) 300 MG capsule Take 300 mg by mouth 2 (two) times daily.    Marland Kitchen. sulfaSALAzine (AZULFIDINE) 500 MG tablet Take 1 tablet (500 mg total) by mouth 2 (two) times daily. 180  tablet 3  . tamsulosin (FLOMAX) 0.4 MG CAPS capsule Take 0.4 mg by mouth daily.    . traMADol (ULTRAM) 50 MG tablet Take by mouth every 6 (six) hours as needed.    . folic acid (FOLVITE) 1 MG tablet Take 1 tablet (1 mg total) by mouth daily. (Patient not taking: Reported on 10/15/2015) 30 tablet 0   No current facility-administered medications on file prior to visit.        Objective:   Physical Exam Blood pressure (!) 90/58, pulse 80, temperature 97.5 F (36.4 C), height 6' (1.829 m), weight 257 lb 11.2 oz (116.9 kg).  Alert and oriented. Skin warm and dry. Oral mucosa is moist.   . Sclera anicteric, conjunctivae is pink. Thyroid not enlarged. No cervical lymphadenopathy. Lungs clear. Heart regular rate and rhythm.  Abdomen is soft. Bowel sounds are positive. No hepatomegaly. No abdominal masses felt. No tenderness.  No edema to lower extremities.      Assessment & Plan:  ?dypsphagia/Hoarseness Am going to get an esophagram and chest  xray Diarrhea: Imodium as needed.

## 2015-10-15 NOTE — Patient Instructions (Addendum)
Esophagram and Chest xray.

## 2015-10-27 ENCOUNTER — Ambulatory Visit (HOSPITAL_COMMUNITY)
Admission: RE | Admit: 2015-10-27 | Discharge: 2015-10-27 | Disposition: A | Discharge status: Home / Self Care | Payer: BLUE CROSS/BLUE SHIELD | Source: Ambulatory Visit | Attending: Internal Medicine | Admitting: Internal Medicine

## 2015-10-27 DIAGNOSIS — R490 Dysphonia: Secondary | ICD-10-CM | POA: Diagnosis not present

## 2015-10-27 DIAGNOSIS — K224 Dyskinesia of esophagus: Secondary | ICD-10-CM | POA: Diagnosis not present

## 2015-10-27 DIAGNOSIS — K219 Gastro-esophageal reflux disease without esophagitis: Secondary | ICD-10-CM | POA: Diagnosis not present

## 2015-10-27 DIAGNOSIS — R1310 Dysphagia, unspecified: Secondary | ICD-10-CM | POA: Diagnosis present

## 2015-10-28 ENCOUNTER — Other Ambulatory Visit (INDEPENDENT_AMBULATORY_CARE_PROVIDER_SITE_OTHER): Payer: Self-pay | Admitting: Internal Medicine

## 2015-10-28 ENCOUNTER — Telehealth (INDEPENDENT_AMBULATORY_CARE_PROVIDER_SITE_OTHER): Payer: Self-pay | Admitting: Internal Medicine

## 2015-10-28 DIAGNOSIS — R1310 Dysphagia, unspecified: Secondary | ICD-10-CM

## 2015-10-28 NOTE — Telephone Encounter (Signed)
error 

## 2016-04-09 ENCOUNTER — Encounter (INDEPENDENT_AMBULATORY_CARE_PROVIDER_SITE_OTHER): Payer: Self-pay

## 2017-12-25 IMAGING — DX DG CHEST 2V
2 series · 2 of 2 positions shown · non-contrast
Comparison: None.

CLINICAL DATA: Syncopal episode today.

EXAM:
CHEST  2 VIEW

[chest pa]
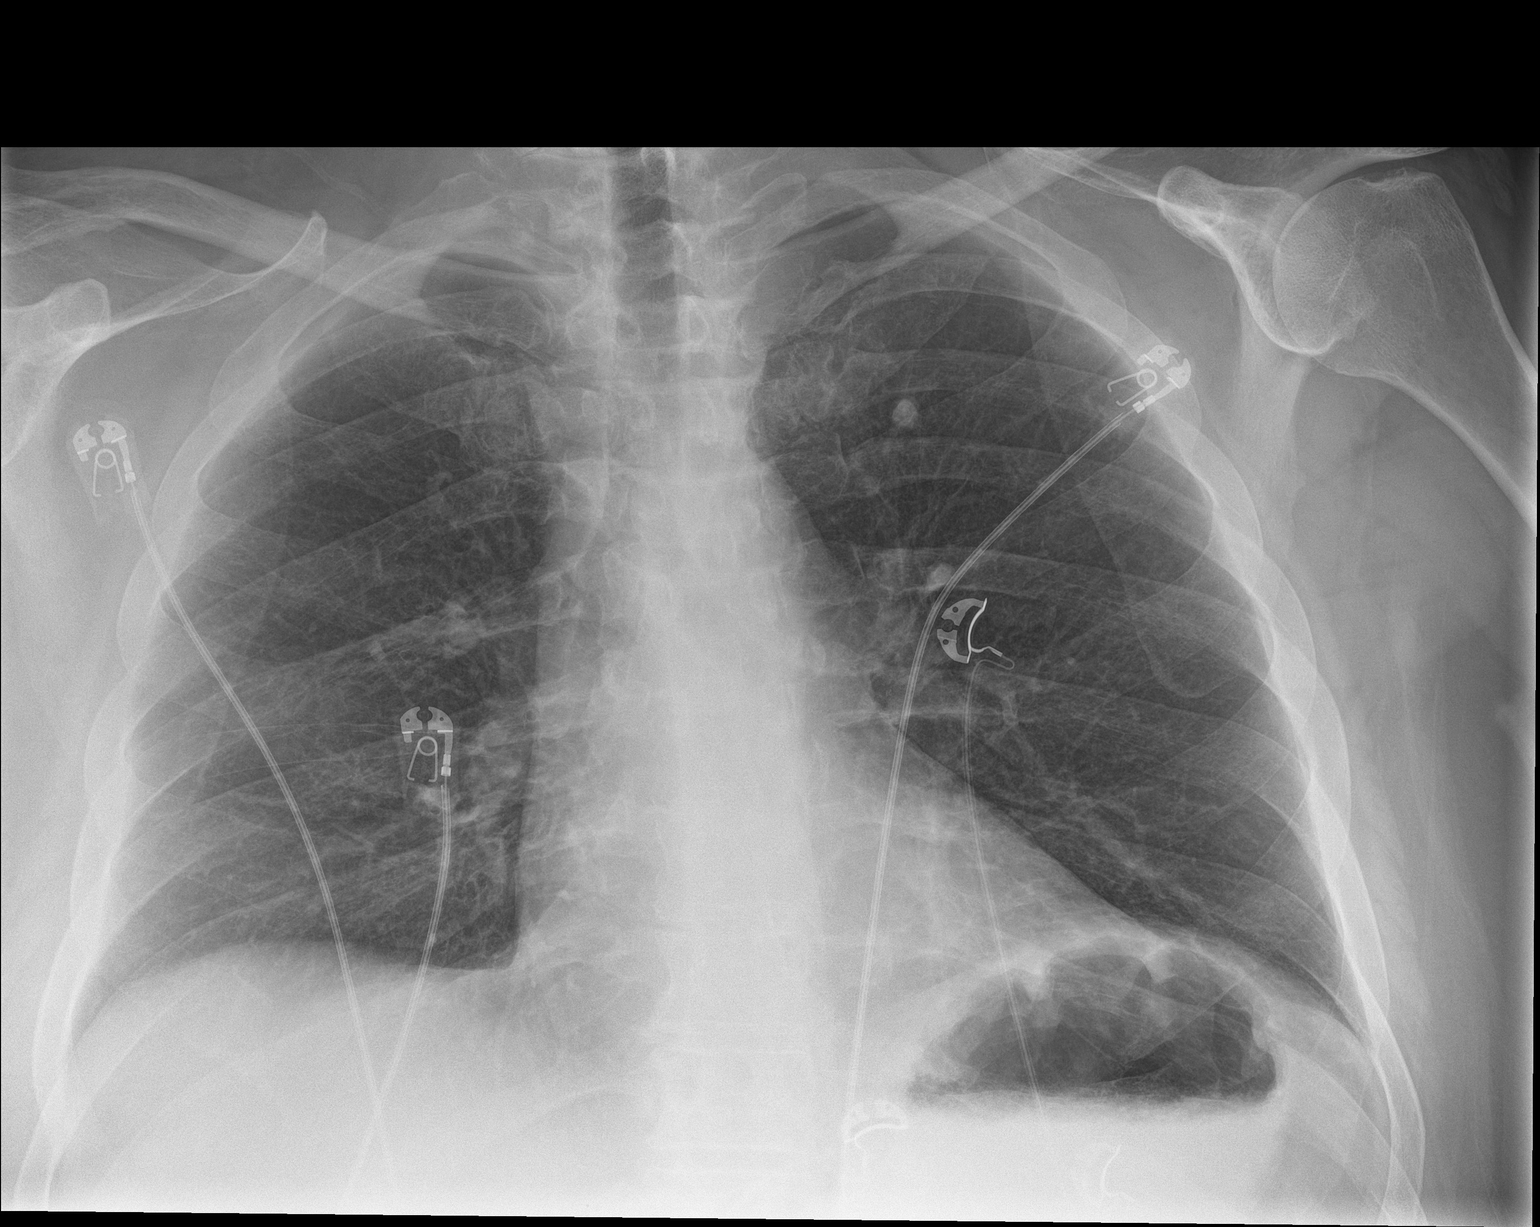

[chest lat]
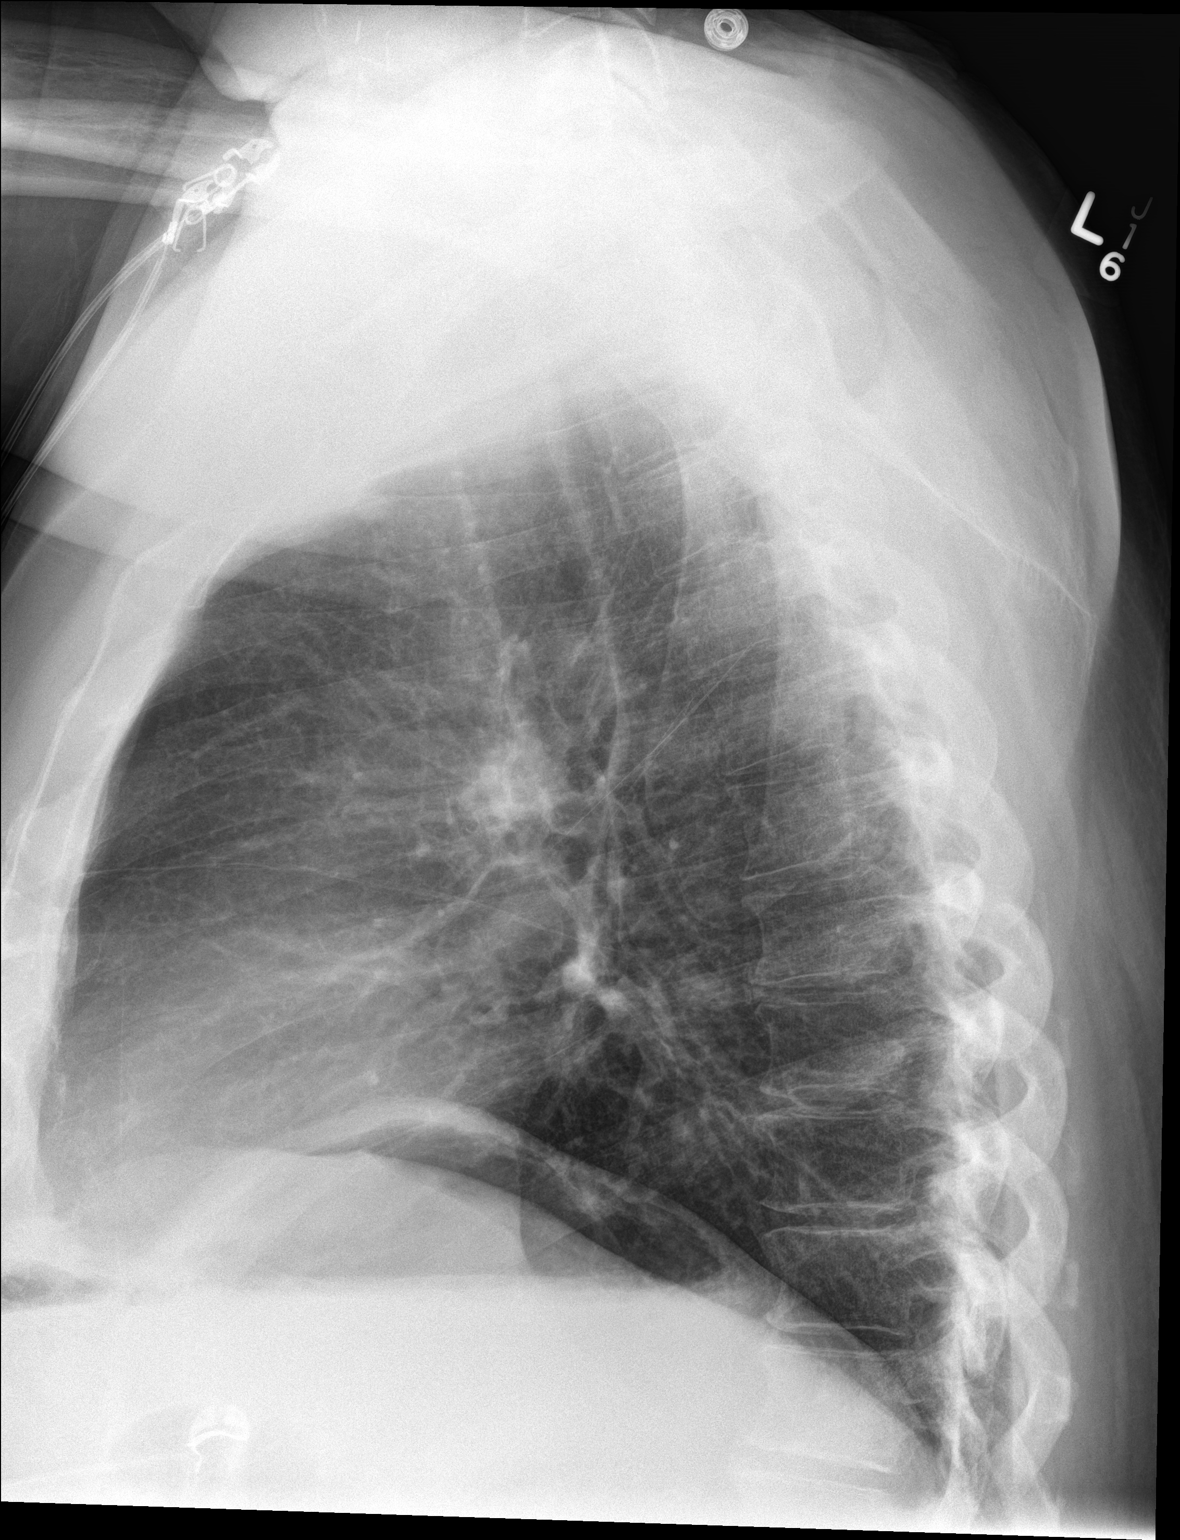

[2 of 2 positions shown; findings below may reference images not displayed]

FINDINGS: Well-defined nodular density in the left upper lung is suggestive
for a calcified granuloma. Otherwise, the lungs are clear. Heart and
mediastinum are within normal limits. The trachea is midline.
Surgical clips in the right lower neck. No large pleural effusions.
IMPRESSION: No active cardiopulmonary disease.

## 2018-06-30 DEATH — deceased
